# Patient Record
Sex: Male | Born: 1975 | Race: Black or African American | Hispanic: No | Marital: Single | Smoking: Never smoker
Health system: Southern US, Community
[De-identification: ages and names within clinical notes are randomized; demographics above are authoritative.]

## PROBLEM LIST (undated history)

## (undated) DIAGNOSIS — I10 Essential (primary) hypertension: Secondary | ICD-10-CM

## (undated) HISTORY — PX: CHOLECYSTECTOMY: SHX55

---

## 2001-03-22 ENCOUNTER — Emergency Department (HOSPITAL_COMMUNITY): Admission: EM | Admit: 2001-03-22 | Discharge: 2001-03-22 | Payer: Self-pay | Admitting: Emergency Medicine

## 2014-02-21 ENCOUNTER — Emergency Department (HOSPITAL_COMMUNITY)
Admission: EM | Admit: 2014-02-21 | Discharge: 2014-02-21 | Disposition: A | Discharge status: Home / Self Care | Payer: BC Managed Care – PPO | Attending: Emergency Medicine | Admitting: Emergency Medicine

## 2014-02-21 ENCOUNTER — Encounter (HOSPITAL_COMMUNITY): Payer: Self-pay | Admitting: Emergency Medicine

## 2014-02-21 DIAGNOSIS — F17200 Nicotine dependence, unspecified, uncomplicated: Secondary | ICD-10-CM | POA: Insufficient documentation

## 2014-02-21 DIAGNOSIS — R197 Diarrhea, unspecified: Secondary | ICD-10-CM | POA: Insufficient documentation

## 2014-02-21 DIAGNOSIS — R1115 Cyclical vomiting syndrome unrelated to migraine: Secondary | ICD-10-CM | POA: Insufficient documentation

## 2014-02-21 DIAGNOSIS — E860 Dehydration: Secondary | ICD-10-CM | POA: Insufficient documentation

## 2014-02-21 LAB — URINALYSIS, ROUTINE W REFLEX MICROSCOPIC
GLUCOSE, UA: NEGATIVE mg/dL
GLUCOSE, UA: NEGATIVE mg/dL
Hgb urine dipstick: NEGATIVE
Hgb urine dipstick: NEGATIVE
KETONES UR: 15 mg/dL — AB
Ketones, ur: 15 mg/dL — AB
Nitrite: POSITIVE — AB
Nitrite: NEGATIVE
PH: 6.5 (ref 5.0–8.0)
Protein, ur: 100 mg/dL — AB
Protein, ur: 30 mg/dL — AB
SPECIFIC GRAVITY, URINE: 1.038 — AB (ref 1.005–1.030)
SPECIFIC GRAVITY, URINE: 1.035 — AB (ref 1.005–1.030)
Urobilinogen, UA: 1 mg/dL (ref 0.0–1.0)
Urobilinogen, UA: 2 mg/dL — ABNORMAL HIGH (ref 0.0–1.0)
pH: 7 (ref 5.0–8.0)

## 2014-02-21 LAB — COMPREHENSIVE METABOLIC PANEL
ALT: 18 U/L (ref 0–53)
AST: 24 U/L (ref 0–37)
Albumin: 4.6 g/dL (ref 3.5–5.2)
Alkaline Phosphatase: 81 U/L (ref 39–117)
Anion gap: 13 (ref 5–15)
BILIRUBIN TOTAL: 1.2 mg/dL (ref 0.3–1.2)
BUN: 9 mg/dL (ref 6–23)
CHLORIDE: 104 meq/L (ref 96–112)
CO2: 26 mEq/L (ref 19–32)
CREATININE: 0.84 mg/dL (ref 0.50–1.35)
Calcium: 9.6 mg/dL (ref 8.4–10.5)
GFR calc Af Amer: >90 mL/min (ref >90)
GFR calc non Af Amer: >90 mL/min (ref >90)
Glucose, Bld: 112 mg/dL — ABNORMAL HIGH (ref 70–99)
Potassium: 3.5 mEq/L — ABNORMAL LOW (ref 3.7–5.3)
Sodium: 143 mEq/L (ref 137–147)
TOTAL PROTEIN: 7.5 g/dL (ref 6.0–8.3)

## 2014-02-21 LAB — CBC WITH DIFFERENTIAL/PLATELET
BASOS PCT: 0 % (ref 0–1)
Basophils Absolute: 0 10*3/uL (ref 0.0–0.1)
Eosinophils Absolute: 0.1 10*3/uL (ref 0.0–0.7)
Eosinophils Relative: 0 % (ref 0–5)
HCT: 48.1 % (ref 39.0–52.0)
Hemoglobin: 16.5 g/dL (ref 13.0–17.0)
Lymphocytes Relative: 17 % (ref 12–46)
Lymphs Abs: 2.2 10*3/uL (ref 0.7–4.0)
MCH: 29.4 pg (ref 26.0–34.0)
MCHC: 34.3 g/dL (ref 30.0–36.0)
MCV: 85.6 fL (ref 78.0–100.0)
MONO ABS: 1 10*3/uL (ref 0.1–1.0)
Monocytes Relative: 8 % (ref 3–12)
NEUTROS ABS: 9.7 10*3/uL — AB (ref 1.7–7.7)
NEUTROS PCT: 75 % (ref 43–77)
Platelets: 199 10*3/uL (ref 150–400)
RBC: 5.62 MIL/uL (ref 4.22–5.81)
RDW: 13.2 % (ref 11.5–15.5)
WBC: 13 10*3/uL — ABNORMAL HIGH (ref 4.0–10.5)

## 2014-02-21 LAB — URINE MICROSCOPIC-ADD ON

## 2014-02-21 MED ORDER — KETOROLAC TROMETHAMINE 30 MG/ML IJ SOLN
30.0000 mg | Freq: Once | INTRAMUSCULAR | Status: AC
  Administered 2014-02-21: 30 mg via INTRAVENOUS
  Filled 2014-02-21: qty 1

## 2014-02-21 MED ORDER — ONDANSETRON 4 MG PO TBDP: ORAL_TABLET | ORAL | Status: DC

## 2014-02-21 MED ORDER — ONDANSETRON HCL 4 MG/2ML IJ SOLN
4.0000 mg | Freq: Once | INTRAMUSCULAR | Status: AC
  Administered 2014-02-21: 4 mg via INTRAVENOUS
  Filled 2014-02-21: qty 2

## 2014-02-21 MED ORDER — METOPROLOL TARTRATE 25 MG PO TABS: 50.0000 mg | ORAL_TABLET | Freq: Once | ORAL | Status: DC

## 2014-02-21 MED ORDER — AMLODIPINE BESYLATE 5 MG PO TABS
10.0000 mg | ORAL_TABLET | Freq: Once | ORAL | Status: AC
  Administered 2014-02-21: 10 mg via ORAL
  Filled 2014-02-21: qty 2

## 2014-02-21 MED ORDER — SODIUM CHLORIDE 0.9 % IV BOLUS (SEPSIS)
2000.0000 mL | Freq: Once | INTRAVENOUS | Status: AC
  Administered 2014-02-21: 2000 mL via INTRAVENOUS

## 2014-02-21 MED ORDER — SODIUM CHLORIDE 0.9 % IV BOLUS (SEPSIS): 1000.0000 mL | Freq: Once | INTRAVENOUS | Status: DC

## 2014-02-21 NOTE — ED Notes (Signed)
MD at bedside. 

## 2014-02-21 NOTE — ED Notes (Signed)
He states "ive had sweats, chills, vomiting and diarrhea since yesterday."

## 2014-02-21 NOTE — ED Provider Notes (Signed)
CSN: [id number]     Arrival date & time 02/21/14  1221 History   First MD Initiated Contact with Patient 02/21/14 1639     Chief Complaint  Patient presents with  . GI Problem     (Consider location/radiation/quality/duration/timing/severity/associated sxs/prior Treatment) The history is provided by the patient.  Paxtyn Boyar is a 37 y.o. male here with chills, vomiting, diarrhea. Symptoms since yesterday. Had numerous episodes of vomiting and diarrhea. Denies abdominal pain. Had some subjective chills as well. No chest pain or cough. No urinary symptoms.    History reviewed. No pertinent past medical history. History reviewed. No pertinent past surgical history. History reviewed. No pertinent family history. History  Substance Use Topics  . Smoking status: Current Every Day Smoker    Types: Cigarettes  . Smokeless tobacco: Not on file  . Alcohol Use: Yes    Review of Systems  Gastrointestinal: Positive for vomiting and diarrhea.  All other systems reviewed and are negative.     Allergies  Review of patient's allergies indicates no known allergies.  Home Medications   Prior to Admission medications   Not on File   BP 143/86  Pulse 51  Temp(Src) 98.5 F (36.9 C) (Oral)  Resp 20  SpO2 98% Physical Exam  Nursing note and vitals reviewed. Constitutional: He is oriented to person, place, and time.  Dehydrated, slightly uncomfortable   HENT:  Head: Normocephalic.  MM dry   Eyes: Conjunctivae are normal. Pupils are equal, round, and reactive to light.  Neck: Normal range of motion. Neck supple.  Cardiovascular: Normal rate, regular rhythm and normal heart sounds.   Pulmonary/Chest: Effort normal and breath sounds normal. No respiratory distress. He has no wheezes. He has no rales.  Abdominal: Soft. Bowel sounds are normal. He exhibits no distension. There is no tenderness. There is no rebound and no guarding.  Musculoskeletal: Normal range of motion. He exhibits  no edema and no tenderness.  Neurological: He is alert and oriented to person, place, and time. No cranial nerve deficit. Coordination normal.  Skin: Skin is warm and dry.  Psychiatric: He has a normal mood and affect. His behavior is normal. Judgment and thought content normal.    ED Course  Procedures (including critical care time) Labs Review Labs Reviewed  CBC WITH DIFFERENTIAL - Abnormal; Notable for the following:    WBC 13.0 (*)    Neutro Abs 9.7 (*)    All other components within normal limits  COMPREHENSIVE METABOLIC PANEL - Abnormal; Notable for the following:    Potassium 3.5 (*)    Glucose, Bld 112 (*)    All other components within normal limits  URINALYSIS, ROUTINE W REFLEX MICROSCOPIC - Abnormal; Notable for the following:    Color, Urine AMBER (*)    Specific Gravity, Urine 1.038 (*)    Bilirubin Urine SMALL (*)    Ketones, ur 15 (*)    Protein, ur 100 (*)    Nitrite POSITIVE (*)    Leukocytes, UA TRACE (*)    All other components within normal limits  URINE MICROSCOPIC-ADD ON - Abnormal; Notable for the following:    Bacteria, UA MANY (*)    All other components within normal limits  URINALYSIS, ROUTINE W REFLEX MICROSCOPIC - Abnormal; Notable for the following:    Color, Urine AMBER (*)    Specific Gravity, Urine 1.035 (*)    Bilirubin Urine SMALL (*)    Ketones, ur 15 (*)    Protein, ur 30 (*)  Urobilinogen, UA 2.0 (*)    Leukocytes, UA TRACE (*)    All other components within normal limits  URINE MICROSCOPIC-ADD ON - Abnormal; Notable for the following:    Bacteria, UA FEW (*)    All other components within normal limits  URINE CULTURE    Imaging Review No results found.   EKG Interpretation None      MDM   Final diagnoses:  None   Valerie SaltsRobert Forti is a 38 y.o. male here with vomiting, diarrhea, likely gastro. Abdomen soft and nontender. I doubt appy. UA contaminated as he didn't give a clean catch. Will hydrate and repeat UA.   8:32  PM Repeat UA showed no obvious UTI. Felt better, tolerated PO fluids. Will d/c home on zofran prn.     Richardean Canalavid H Yao, MD 02/21/14 2032

## 2014-02-21 NOTE — ED Notes (Signed)
Waiting for fluid infusion to be completed

## 2014-02-21 NOTE — ED Notes (Signed)
Pt states he is unable to give urine at this time.

## 2014-02-21 NOTE — Discharge Instructions (Signed)
Rest for 3 days.   Stay hydrated.   Take zofran for nausea and tylenol and motrin for pain.   Follow up with your doctor.   Return to ER if you have vomiting, dehydration, severe abdominal pain, fevers.

## 2014-02-22 LAB — URINE CULTURE
Colony Count: NO GROWTH
Culture: NO GROWTH

## 2015-11-07 ENCOUNTER — Emergency Department (HOSPITAL_COMMUNITY)
Admission: EM | Admit: 2015-11-07 | Discharge: 2015-11-07 | Disposition: A | Discharge status: Home / Self Care | Payer: BLUE CROSS/BLUE SHIELD | Attending: Emergency Medicine | Admitting: Emergency Medicine

## 2015-11-07 ENCOUNTER — Encounter (HOSPITAL_COMMUNITY): Payer: Self-pay

## 2015-11-07 ENCOUNTER — Emergency Department (HOSPITAL_COMMUNITY): Payer: BLUE CROSS/BLUE SHIELD

## 2015-11-07 DIAGNOSIS — M25572 Pain in left ankle and joints of left foot: Secondary | ICD-10-CM

## 2015-11-07 DIAGNOSIS — Y9289 Other specified places as the place of occurrence of the external cause: Secondary | ICD-10-CM | POA: Insufficient documentation

## 2015-11-07 DIAGNOSIS — S93402A Sprain of unspecified ligament of left ankle, initial encounter: Secondary | ICD-10-CM | POA: Diagnosis not present

## 2015-11-07 DIAGNOSIS — F17210 Nicotine dependence, cigarettes, uncomplicated: Secondary | ICD-10-CM | POA: Insufficient documentation

## 2015-11-07 DIAGNOSIS — W228XXA Striking against or struck by other objects, initial encounter: Secondary | ICD-10-CM | POA: Insufficient documentation

## 2015-11-07 DIAGNOSIS — S99912A Unspecified injury of left ankle, initial encounter: Secondary | ICD-10-CM | POA: Diagnosis present

## 2015-11-07 DIAGNOSIS — Y998 Other external cause status: Secondary | ICD-10-CM | POA: Insufficient documentation

## 2015-11-07 DIAGNOSIS — Y9364 Activity, baseball: Secondary | ICD-10-CM | POA: Diagnosis not present

## 2015-11-07 DIAGNOSIS — Z79899 Other long term (current) drug therapy: Secondary | ICD-10-CM | POA: Diagnosis not present

## 2015-11-07 MED ORDER — ACETAMINOPHEN 325 MG PO TABS
650.0000 mg | ORAL_TABLET | Freq: Once | ORAL | Status: AC
  Administered 2015-11-07: 650 mg via ORAL
  Filled 2015-11-07: qty 2

## 2015-11-07 MED ORDER — NAPROXEN 500 MG PO TABS: 500.0000 mg | ORAL_TABLET | Freq: Two times a day (BID) | ORAL | Status: DC

## 2015-11-07 NOTE — ED Notes (Signed)
Pt c/o L ankle pain after sliding into a base last night.  Pain score 8/10.  Pt reports taking Aleve earlier w/ a little relief.  Swelling noted.

## 2015-11-07 NOTE — ED Notes (Signed)
Patient was alert, oriented and stable upon discharge. RN went over AVS and patient had no further questions.  

## 2015-11-07 NOTE — ED Provider Notes (Signed)
CSN: [id number]     Arrival date & time 11/07/15  1635 History  By signing my name below, I, Tanda Rockers, attest that this documentation has been prepared under the direction and in the presence of Cheri Fowler, PA-C. Electronically Signed: Tanda Rockers, ED Scribe. 11/07/2015. 4:59 PM.   Chief Complaint  Patient presents with  . Ankle Pain   The history is provided by the patient. No language interpreter was used.     HPI Comments: Emmerich Cryer is a 40 y.o. male who presents to the Emergency Department complaining of sudden onset, constant, worsening, left ankle pain that began yesterday. Pt reports that he was playing softball and slid to the base, hitting his foot onto the base and twisting the ankle. Pt played the rest of the game with some pain, but noticed increased swelling afterwards. When he woke up this morning he had worsening pain to the area and was unable to bear weight onto the left leg. His pain is exacerbated with bending of the ankle. Pt has been taking Aleve without relief. Denies weakness, numbness, tingling, knee pain, or any other associated symptoms.   History reviewed. No pertinent past medical history. Past Surgical History  Procedure Laterality Date  . Cholecystectomy     History reviewed. No pertinent family history. Social History  Substance Use Topics  . Smoking status: Current Every Day Smoker    Types: Cigars  . Smokeless tobacco: None  . Alcohol Use: Yes    Review of Systems  Constitutional: Negative for fever.  Musculoskeletal: Positive for joint swelling and arthralgias (left ankle).  Skin: Negative for color change and wound.  Neurological: Negative for weakness and numbness.  All other systems reviewed and are negative.  Allergies  Review of patient's allergies indicates no known allergies.  Home Medications   Prior to Admission medications   Medication Sig Start Date End Date Taking? Authorizing Provider  amLODipine (NORVASC) 10 MG  tablet Take 10 mg by mouth daily.   Yes Historical Provider, MD  naproxen sodium (ANAPROX) 220 MG tablet Take 440 mg by mouth 2 (two) times daily as needed (pain).   Yes Historical Provider, MD  naproxen (NAPROSYN) 500 MG tablet Take 1 tablet (500 mg total) by mouth 2 (two) times daily. 11/07/15   Cheri Fowler, PA-C  ondansetron (ZOFRAN ODT) 4 MG disintegrating tablet  ODT q4 hours prn nausea/vomit Patient not taking: Reported on 11/07/2015 02/21/14   Richardean Canal, MD   BP 153/90 mmHg  Pulse 83  Temp(Src) 98.3 F (36.8 C) (Oral)  Resp 16  SpO2 98%   Physical Exam  Constitutional: He is oriented to person, place, and time. He appears well-developed and well-nourished.  Non-toxic appearance. He does not have a sickly appearance. He does not appear ill.  HENT:  Head: Normocephalic and atraumatic.  Mouth/Throat: Oropharynx is clear and moist.  Eyes: Conjunctivae are normal. Pupils are equal, round, and reactive to light.  Neck: Normal range of motion. Neck supple.  Cardiovascular: Normal rate, regular rhythm and normal heart sounds.   No murmur heard. Pulses:      Dorsalis pedis pulses are 2+ on the left side.  Pulmonary/Chest: Effort normal and breath sounds normal. No accessory muscle usage or stridor. No respiratory distress. He has no wheezes. He has no rhonchi. He has no rales.  Abdominal: Soft. Bowel sounds are normal. He exhibits no distension. There is no tenderness.  Musculoskeletal: Normal range of motion.       Left ankle:  He exhibits swelling. He exhibits normal range of motion, no ecchymosis, no deformity and normal pulse. Tenderness. Lateral malleolus and CF ligament tenderness found. Achilles tendon normal.  Lymphadenopathy:    He has no cervical adenopathy.  Neurological: He is alert and oriented to person, place, and time.  Strength and sensation intact distal to injury.   Skin: Skin is warm and dry.  Psychiatric: He has a normal mood and affect. His behavior is normal.     ED Course  Procedures (including critical care time)  DIAGNOSTIC STUDIES: Oxygen Saturation is 98% on RA, normal by my interpretation.    COORDINATION OF CARE: 4:57 PM-Discussed treatment plan which includes DG L Ankle with pt at bedside and pt agreed to plan.   Labs Review Labs Reviewed - No data to display  Imaging Review Dg Ankle Complete Left  11/07/2015  CLINICAL DATA:  Left ankle pain with weight-bearing after sliding into base last night. EXAM: LEFT ANKLE COMPLETE - 3+ VIEW COMPARISON:  None. FINDINGS: Mild inferior spurring of both the medial and lateral malleoli. Spurring along the distal tibial rim noted. No definite tibiotalar joint effusion. No abnormal widening of the mortise. Mild spurring of the talar neck dorsally and also of the navicular, along with plantar and Achilles calcaneal spurs. Questionable mild soft tissue swelling below the level of the medial malleolus. No definite swelling overlying the lateral malleolus. I do not see a discrete fracture. IMPRESSION: 1. Considerable spurring in the hindfoot and dorsal midfoot for age. I do not see a discrete fracture. If pain persists despite conservative therapy, MRI may be warranted for further characterization. Electronically Signed   By: Gaylyn RongWalter  Liebkemann M.D.   On: 11/07/2015 17:35   I have personally reviewed and evaluated these images as part of my medical decision-making.   EKG Interpretation None      MDM   Final diagnoses:  Left ankle pain  Ankle sprain, left, initial encounter    Plain films negative for acute fracture.  Considerable spurring in hindfoot and lateral malleolus.  If pain persists despite conservative therapy, recommend MRI.  Neurovascularly intact.  Mild swelling and tenderness on exam.  Patient given Tylenol, brace, and crutches in ED.  Plan to discharge home with Naproxen.  Ortho follow up.  Discussed return precautions.  Patient agrees and acknowledges the above plan for discharge.   I  personally performed the services described in this documentation, which was scribed in my presence. The recorded information has been reviewed and is accurate.      Cheri FowlerKayla Adalid Beckmann, PA-C 11/07/15 1811  Marily MemosJason Mesner, MD 11/08/15 1113

## 2015-11-07 NOTE — Discharge Instructions (Signed)
There is no evidence of fracture or dislocation on your x-rays.  However, they did note considerable spurring in the hindfoot and dorsal midfoot for age.  If pain persists despite conservative therapy, MRI may be warranted for further Characterization.  Take Naproxen twice a day.  Rest, Ice, Compression, and Elevation for symptom control.   Ankle Sprain An ankle sprain is an injury to the strong, fibrous tissues (ligaments) that hold the bones of your ankle joint together.  CAUSES An ankle sprain is usually caused by a fall or by twisting your ankle. Ankle sprains most commonly occur when you step on the outer edge of your foot, and your ankle turns inward. People who participate in sports are more prone to these types of injuries.  SYMPTOMS   Pain in your ankle. The pain may be present at rest or only when you are trying to stand or walk.  Swelling.  Bruising. Bruising may develop immediately or within 1 to 2 days after your injury.  Difficulty standing or walking, particularly when turning corners or changing directions. DIAGNOSIS  Your caregiver will ask you details about your injury and perform a physical exam of your ankle to determine if you have an ankle sprain. During the physical exam, your caregiver will press on and apply pressure to specific areas of your foot and ankle. Your caregiver will try to move your ankle in certain ways. An X-ray exam may be done to be sure a bone was not broken or a ligament did not separate from one of the bones in your ankle (avulsion fracture).  TREATMENT  Certain types of braces can help stabilize your ankle. Your caregiver can make a recommendation for this. Your caregiver may recommend the use of medicine for pain. If your sprain is severe, your caregiver may refer you to a surgeon who helps to restore function to parts of your skeletal system (orthopedist) or a physical therapist. HOME CARE INSTRUCTIONS   Apply ice to your injury for 1-2 days or as  directed by your caregiver. Applying ice helps to reduce inflammation and pain.  Put ice in a plastic bag.  Place a towel between your skin and the bag.  Leave the ice on for 15-20 minutes at a time, every 2 hours while you are awake.  Only take over-the-counter or prescription medicines for pain, discomfort, or fever as directed by your caregiver.  Elevate your injured ankle above the level of your heart as much as possible for 2-3 days.  If your caregiver recommends crutches, use them as instructed. Gradually put weight on the affected ankle. Continue to use crutches or a cane until you can walk without feeling pain in your ankle.  If you have a plaster splint, wear the splint as directed by your caregiver. Do not rest it on anything harder than a pillow for the first 24 hours. Do not put weight on it. Do not get it wet. You may take it off to take a shower or bath.  You may have been given an elastic bandage to wear around your ankle to provide support. If the elastic bandage is too tight (you have numbness or tingling in your foot or your foot becomes cold and blue), adjust the bandage to make it comfortable.  If you have an air splint, you may blow more air into it or let air out to make it more comfortable. You may take your splint off at night and before taking a shower or bath. Wiggle your  toes in the splint several times per day to decrease swelling. SEEK MEDICAL CARE IF:   You have rapidly increasing bruising or swelling.  Your toes feel extremely cold or you lose feeling in your foot.  Your pain is not relieved with medicine. SEEK IMMEDIATE MEDICAL CARE IF:  Your toes are numb or blue.  You have severe pain that is increasing. MAKE SURE YOU:   Understand these instructions.  Will watch your condition.  Will get help right away if you are not doing well or get worse.   This information is not intended to replace advice given to you by your health care provider. Make  sure you discuss any questions you have with your health care provider.   Document Released: 07/21/2005 Document Revised: 08/11/2014 Document Reviewed: 08/02/2011 Elsevier Interactive Patient Education [employer]

## 2016-08-24 IMAGING — CR DG ANKLE COMPLETE 3+V*L*
3 series · 3 of 3 positions shown · non-contrast
Comparison: None.

CLINICAL DATA: Left ankle pain with weight-bearing after sliding
into base last night.

EXAM:
LEFT ANKLE COMPLETE - 3+ VIEW

[x ankle ap left]
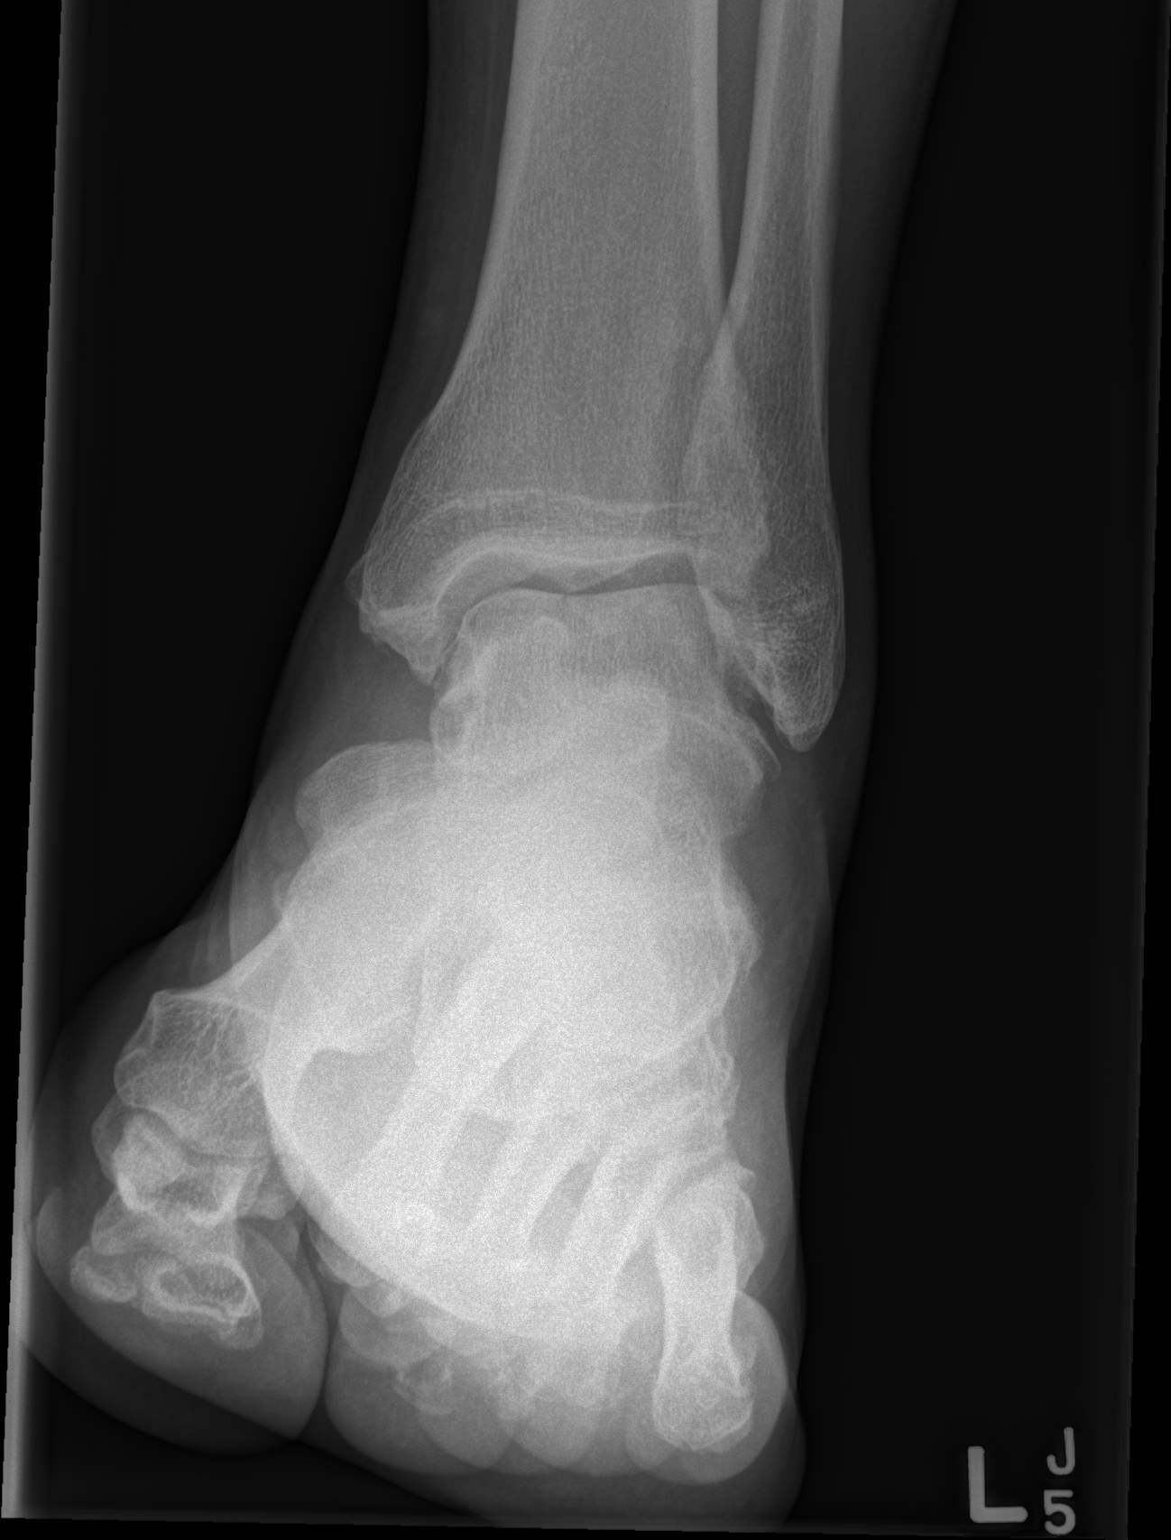

[x ankle lat left]
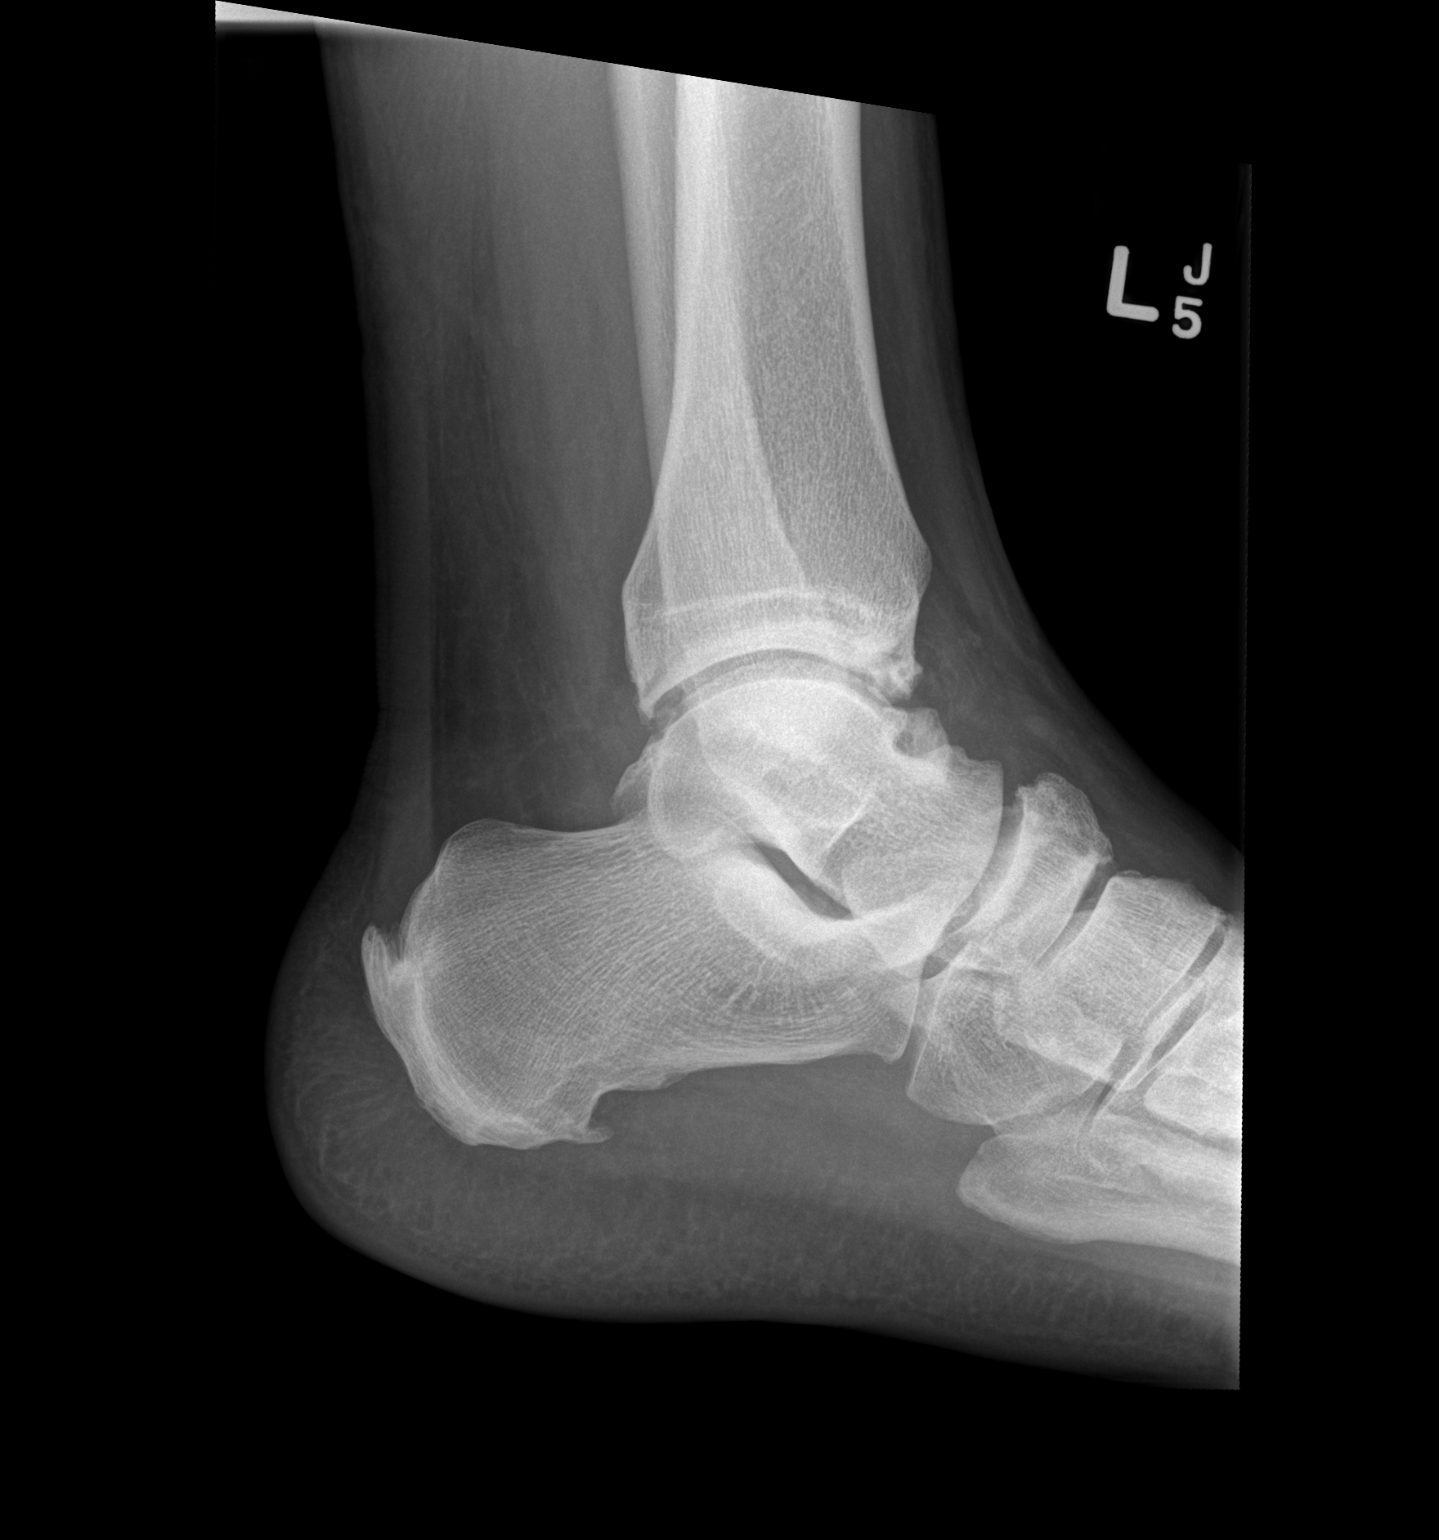

[x ankle obl left]
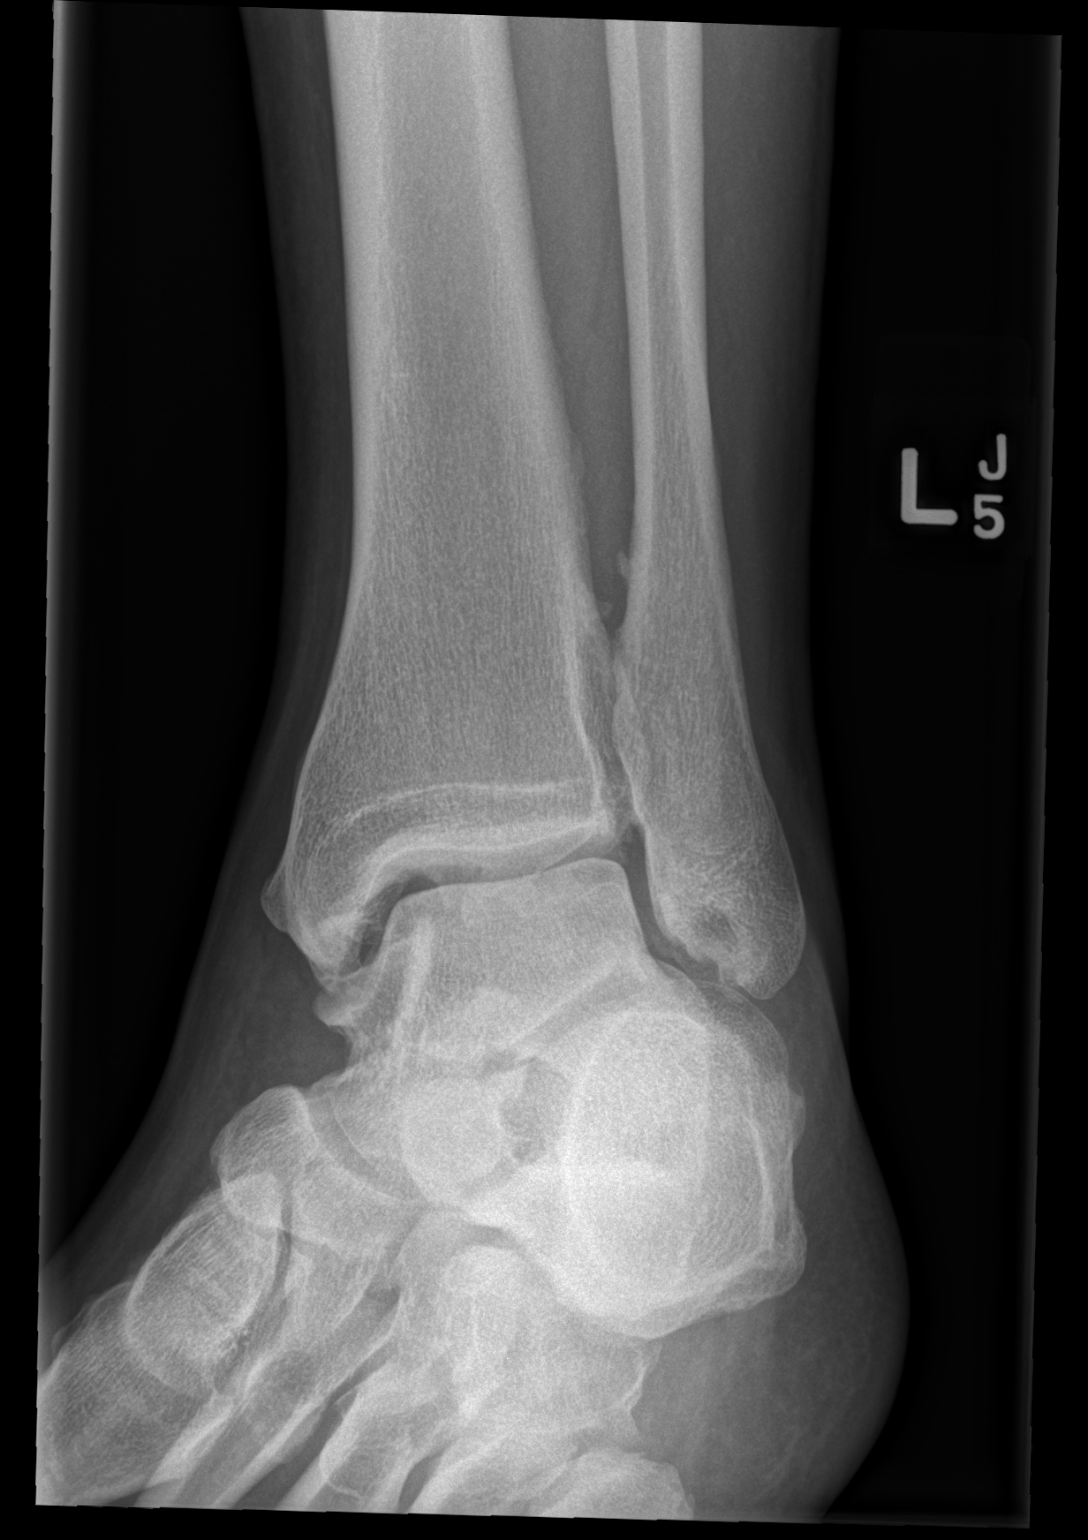

[3 of 3 positions shown; findings below may reference images not displayed]

FINDINGS: Mild inferior spurring of both the medial and lateral malleoli.
Spurring along the distal tibial rim noted. No definite tibiotalar
joint effusion. No abnormal widening of the mortise. Mild spurring
of the talar neck dorsally and also of the navicular, along with
plantar and Achilles calcaneal spurs.

Questionable mild soft tissue swelling below the level of the medial
malleolus. No definite swelling overlying the lateral malleolus. I
do not see a discrete fracture.
IMPRESSION: 1. Considerable spurring in the hindfoot and dorsal midfoot for age.
I do not see a discrete fracture. If pain persists despite
conservative therapy, MRI may be warranted for further
characterization.

## 2019-09-14 ENCOUNTER — Encounter: Payer: Self-pay | Admitting: Cardiovascular Disease

## 2019-09-14 ENCOUNTER — Ambulatory Visit (INDEPENDENT_AMBULATORY_CARE_PROVIDER_SITE_OTHER): Payer: Self-pay | Admitting: Cardiovascular Disease

## 2019-09-14 ENCOUNTER — Other Ambulatory Visit: Payer: Self-pay

## 2019-09-14 DIAGNOSIS — E669 Obesity, unspecified: Secondary | ICD-10-CM | POA: Insufficient documentation

## 2019-09-14 DIAGNOSIS — I10 Essential (primary) hypertension: Secondary | ICD-10-CM

## 2019-09-14 HISTORY — DX: Obesity, unspecified: E66.9

## 2019-09-14 HISTORY — DX: Morbid (severe) obesity due to excess calories: E66.01

## 2019-09-14 MED ORDER — AMLODIPINE BESYLATE 10 MG PO TABS: 10.0000 mg | ORAL_TABLET | Freq: Every day | ORAL | 5 refills | Status: DC

## 2019-09-14 NOTE — Patient Instructions (Addendum)
Medication Instructions:  START AMLODIPINE 10 MG DAILY    Labwork: CMET/A1C TODAY    Testing/Procedures: WILL DISCUSS SLEEP STUDY AT FOLLOW UP    Follow-Up: PHARM D IN OFFICE 09/29/2019 AT 8:00 AM    You will receive a phone call from the PREP exercise and nutrition program to schedule an initial assessment.  Special Instructions:   MONITOR YOUR BLOOD PRESSURE TWICE A DAY, LOG IN THE BOOK PROVIDED   If you have questions regarding your blood pressure or any medication changes from Monday through Friday, 8 AM through 5 PM please call Rose Fillers, RN [phone]) or Viann Fish, RN [phone]).  If you are in need of emergency care after hours, please call 911 or be seen in the emergency room.  DASH Eating Plan DASH stands for "Dietary Approaches to Stop Hypertension." The DASH eating plan is a healthy eating plan that has been shown to reduce high blood pressure (hypertension). It may also reduce your risk for type 2 diabetes, heart disease, and stroke. The DASH eating plan may also help with weight loss. What are tips for following this plan?  General guidelines  Avoid eating more than 2,300 mg (milligrams) of salt (sodium) a day. If you have hypertension, you may need to reduce your sodium intake to 1,500 mg a day.  Limit alcohol intake to no more than 1 drink a day for nonpregnant women and 2 drinks a day for men. One drink equals 12 oz of beer, 5 oz of wine, or 1 oz of hard liquor.  Work with your health care provider to maintain a healthy body weight or to lose weight. Ask what an ideal weight is for you.  Get at least 30 minutes of exercise that causes your heart to beat faster (aerobic exercise) most days of the week. Activities may include walking, swimming, or biking.  Work with your health care provider or diet and nutrition specialist (dietitian) to adjust your eating plan to your individual calorie needs. Reading food labels   Check food labels for the amount  of sodium per serving. Choose foods with less than 5 percent of the Daily Value of sodium. Generally, foods with less than 300 mg of sodium per serving fit into this eating plan.  To find whole grains, look for the word "whole" as the first word in the ingredient list. Shopping  Buy products labeled as "low-sodium" or "no salt added."  Buy fresh foods. Avoid canned foods and premade or frozen meals. Cooking  Avoid adding salt when cooking. Use salt-free seasonings or herbs instead of table salt or sea salt. Check with your health care provider or pharmacist before using salt substitutes.  Do not fry foods. Cook foods using healthy methods such as baking, boiling, grilling, and broiling instead.  Cook with heart-healthy oils, such as olive, canola, soybean, or sunflower oil. Meal planning  Eat a balanced diet that includes: ? 5 or more servings of fruits and vegetables each day. At each meal, try to fill half of your plate with fruits and vegetables. ? Up to 6-8 servings of whole grains each day. ? Less than 6 oz of lean meat, poultry, or fish each day. A 3-oz serving of meat is about the same size as a deck of cards. One egg equals 1 oz. ? 2 servings of low-fat dairy each day. ? A serving of nuts, seeds, or beans 5 times each week. ? Heart-healthy fats. Healthy fats called Omega-3 fatty acids are found in foods such  as flaxseeds and coldwater fish, like sardines, salmon, and mackerel.  Limit how much you eat of the following: ? Canned or prepackaged foods. ? Food that is high in trans fat, such as fried foods. ? Food that is high in saturated fat, such as fatty meat. ? Sweets, desserts, sugary drinks, and other foods with added sugar. ? Full-fat dairy products.  Do not salt foods before eating.  Try to eat at least 2 vegetarian meals each week.  Eat more home-cooked food and less restaurant, buffet, and fast food.  When eating at a restaurant, ask that your food be prepared with  less salt or no salt, if possible. What foods are recommended? The items listed may not be a complete list. Talk with your dietitian about what dietary choices are best for you. Grains Whole-grain or whole-wheat bread. Whole-grain or whole-wheat pasta. Brown rice. [place]. Bulgur. Whole-grain and low-sodium cereals. Pita bread. Low-fat, low-sodium crackers. Whole-wheat flour tortillas. Vegetables Fresh or frozen vegetables (raw, steamed, roasted, or grilled). Low-sodium or reduced-sodium tomato and vegetable juice. Low-sodium or reduced-sodium tomato sauce and tomato paste. Low-sodium or reduced-sodium canned vegetables. Fruits All fresh, dried, or frozen fruit. Canned fruit in natural juice (without added sugar). Meat and other protein foods Skinless chicken or [place]. Ground chicken or [place]. Pork with fat trimmed off. Fish and seafood. Egg whites. Dried beans, peas, or lentils. Unsalted nuts, nut butters, and seeds. Unsalted canned beans. Lean cuts of beef with fat trimmed off. Low-sodium, lean deli meat. Dairy Low-fat (1%) or fat-free (skim) milk. Fat-free, low-fat, or reduced-fat cheeses. Nonfat, low-sodium ricotta or cottage cheese. Low-fat or nonfat yogurt. Low-fat, low-sodium cheese. Fats and oils Soft margarine without trans fats. Vegetable oil. Low-fat, reduced-fat, or light mayonnaise and salad dressings (reduced-sodium). Canola, safflower, olive, soybean, and sunflower oils. Avocado. Seasoning and other foods Herbs. Spices. Seasoning mixes without salt. Unsalted popcorn and pretzels. Fat-free sweets. What foods are not recommended? The items listed may not be a complete list. Talk with your dietitian about what dietary choices are best for you. Grains Baked goods made with fat, such as croissants, muffins, or some breads. Dry pasta or rice meal packs. Vegetables Creamed or fried vegetables. Vegetables in a cheese sauce. Regular canned vegetables (not low-sodium or  reduced-sodium). Regular canned tomato sauce and paste (not low-sodium or reduced-sodium). Regular tomato and vegetable juice (not low-sodium or reduced-sodium). Angie Fava. Olives. Fruits Canned fruit in a light or heavy syrup. Fried fruit. Fruit in cream or butter sauce. Meat and other protein foods Fatty cuts of meat. Ribs. Fried meat. Berniece Salines. Sausage. Bologna and other processed lunch meats. Salami. Fatback. Hotdogs. Bratwurst. Salted nuts and seeds. Canned beans with added salt. Canned or smoked fish. Whole eggs or egg yolks. Chicken or [place] with skin. Dairy Whole or 2% milk, cream, and half-and-half. Whole or full-fat cream cheese. Whole-fat or sweetened yogurt. Full-fat cheese. Nondairy creamers. Whipped toppings. Processed cheese and cheese spreads. Fats and oils Butter. Stick margarine. Lard. Shortening. Ghee. Bacon fat. Tropical oils, such as coconut, palm kernel, or palm oil. Seasoning and other foods Salted popcorn and pretzels. Onion salt, garlic salt, seasoned salt, table salt, and sea salt. Worcestershire sauce. Tartar sauce. Barbecue sauce. Teriyaki sauce. Soy sauce, including reduced-sodium. Steak sauce. Canned and packaged gravies. Fish sauce. Oyster sauce. Cocktail sauce. Horseradish that you find on the shelf. Ketchup. Mustard. Meat flavorings and tenderizers. Bouillon cubes. Hot sauce and Tabasco sauce. Premade or packaged marinades. Premade or packaged taco seasonings. Relishes. Regular salad dressings.  Where to find more information:  National Heart, Lung, and [place]: https://wilson-eaton.com/  American Heart Association: www.heart.org Summary  The DASH eating plan is a healthy eating plan that has been shown to reduce high blood pressure (hypertension). It may also reduce your risk for type 2 diabetes, heart disease, and stroke.  With the DASH eating plan, you should limit salt (sodium) intake to 2,300 mg a day. If you have hypertension, you may need to reduce your sodium  intake to 1,500 mg a day.  When on the DASH eating plan, aim to eat more fresh fruits and vegetables, whole grains, lean proteins, low-fat dairy, and heart-healthy fats.  Work with your health care provider or diet and nutrition specialist (dietitian) to adjust your eating plan to your individual calorie needs. This information is not intended to replace advice given to you by your health care provider. Make sure you discuss any questions you have with your health care provider. Document Released: 07/10/2011 Document Revised: 07/03/2017 Document Reviewed: 07/14/2016 Elsevier Patient Education  2020 [employer].

## 2019-09-14 NOTE — Progress Notes (Signed)
Hypertension Clinic Initial Assessment:    Date:  09/14/2019   ID:  Christopher Bruce, DOB 12-08-1975, MRN [id number]  PCP:  Patient, No Pcp Per  Cardiologist:  No primary care provider on file.  Nephrologist:  Referring MD: No ref. provider found   CC: Hypertension  History of Present Illness:    Christopher Bruce is a 44 y.o. male with a hx of hypertension and obestiy here to establish care in the hypertension clinic.  He reports that he was first diagnosed with hypertension at around 36.  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  He followed up with the doctor regularly and his BP was well-controlled on amlodipine 10 mg.  Since moving to [place] several years ago, he hasn't had insurance and hasn't been on medicine.  He went to the dentist three weeks ago and his BP was elevated to over 200 mmHg systolic.  He was told that his BP had to be controlled to have his dental procedure.  He stopped drinking coffee 1 month ago.  He has been working to improve his diet.  He mostly eats at home and does not add salt to his food.  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  He has no exertional chest pain or shortness of breath.  He was hoping to join the [facility].  He denies lower extremity edema, orthopnea, or PND.  He does report snoring and his wife thinks he has apneic episodes.  Previous antihypertensives: Amlodipine   Past Medical History:  Diagnosis Date  . Morbid obesity (HCC) 09/14/2019    Past Surgical History:  Procedure Laterality Date  . CHOLECYSTECTOMY      Current Medications: Current Meds  Medication Sig  . [DISCONTINUED] naproxen (NAPROSYN) 500 MG tablet Take 1 tablet (500 mg total) by mouth 2 (two) times daily.  . [DISCONTINUED] naproxen sodium (ANAPROX) 220 MG tablet Take 440 mg by mouth 2 (two) times daily as needed (pain).  . [DISCONTINUED] ondansetron (ZOFRAN ODT) 4 MG disintegrating tablet 4mg  ODT q4 hours prn  nausea/vomit     Allergies:   Patient has no known allergies.   Social History   Socioeconomic History  . Marital status: Single    Spouse name: Not on file  . Number of children: Not on file  . Years of education: Not on file  . Highest education level: Not on file  Occupational History  . Not on file  Tobacco Use  . Smoking status: Never Smoker  . Smokeless tobacco: Never Used  Substance and Sexual Activity  . Alcohol use: Yes  . Drug use: No  . Sexual activity: Not on file  Other Topics Concern  . Not on file  Social History Narrative  . Not on file   Social Determinants of Health   Financial Resource Strain:   . Difficulty of Paying Living Expenses: Not on file  Food Insecurity: No Food Insecurity  . Worried About in the Last Year: Never true  . Ran Out of Food in the Last Year: Never true  Transportation Needs: No Transportation Needs  . Lack of Transportation (Medical): No  . Lack of Transportation (Non-Medical): No  Physical Activity: Insufficiently Active  . Days of Exercise per Week: 7 days  . Minutes of Exercise per Session: 20 min  Stress: No Stress Concern Present  . Feeling of Stress : Not at all  Social Connections:   .  Frequency of Communication with Friends and Family: Not on file  . Frequency of Social Gatherings with Friends and Family: Not on file  . Attends Religious Services: Not on file  . Active Member of Clubs or Organizations: Not on file  . Attends Banker Meetings: Not on file  . Marital Status: Not on file     Family History: The patient's family history includes Appendicitis in his mother; HIV in his father.  ROS:   Please see the history of present illness.     All other systems reviewed and are negative.  EKGs/Labs/Other Studies Reviewed:    EKG:  EKG is ordered today.  The ekg ordered today demonstrates sinus rhythm.  Rate 92 bpm.  LAD.  Incomplete LBBB.  LVH with repolarization abnormalities.    Recent Labs: No results found for requested labs within last 8760 hours.   Recent Lipid Panel No results found for: CHOL, TRIG, HDL, CHOLHDL, VLDL, LDLCALC, LDLDIRECT  Physical Exam:     Wt Readings from Last 3 Encounters:  09/14/19 (!) 374 lb (169.6 kg)  VS:  BP (!) 188/100 (BP Location: Left Arm, Patient Position: Sitting, Cuff Size: Large)   Pulse 92   Temp (!) 95.4 F (35.2 C)   Ht 6\' 9"  (2.057 m)   Wt (!) 374 lb (169.6 kg)   BMI 40.08 kg/m  , BMI Body mass index is 40.08 kg/m. GENERAL:  Well appearing HEENT: Pupils equal round and reactive, fundi not visualized, oral mucosa unremarkable NECK:  No jugular venous distention, waveform within normal limits, carotid upstroke brisk and symmetric, no bruits LUNGS:  Clear to auscultation bilaterally HEART:  RRR.  PMI not displaced or sustained,S1 and S2 within normal limits, no S3, no S4, no clicks, no rubs, no murmurs ABD:  Flat, positive bowel sounds normal in frequency in pitch, no bruits, no rebound, no guarding, no midline pulsatile mass, no hepatomegaly, no splenomegaly EXT:  2 plus pulses throughout, no edema, no cyanosis no clubbing SKIN:  No rashes no nodules NEURO:  Cranial nerves II through XII grossly intact, motor grossly intact throughout PSYCH:  Cognitively intact, oriented to person place and time   ASSESSMENT:    1. Morbid obesity (HCC)   2. Hypertension, unspecified type   3. Essential hypertension     PLAN:    # Hypertension:  BP poorly controlled.  He thinks it is higher than usual because of white coat hypertension.  We will resume amlodipine 10mg .  We have provided a BP cuff and asked him to track his BP twice daily.  Work on limiting salt and increasing exercise to 150 minutes weekly once BP is better controlled.  He is interested in participating in the PREP program.  Check CMP and hemoglobin A1c.   # Possible OSA: He has snoring and apneic episodes.  Will need a sleep study.  However will discuss  later after he meets with social work as he is currently uninsured.   # Morbid Obesity:  Exercise as above.  Check hemoglobin A1c.   Disposition:    FU with MD in 4 months.  PharmD in 2 weeks.   Medication Adjustments/Labs and Tests Ordered: Current medicines are reviewed at length with the patient today.  Concerns regarding medicines are outlined above.  Orders Placed This Encounter  Procedures  . Comprehensive metabolic panel  . HgB A1c  . EKG 12-Lead   Meds ordered this encounter  Medications  . amLODipine (NORVASC) 10 MG tablet  Sig: Take 1 tablet (10 mg total) by mouth daily.    Dispense:  30 tablet    Refill:  5     Signed, Skeet Latch, MD  09/14/2019 4:53 PM    [place]

## 2019-09-14 NOTE — Addendum Note (Signed)
Addended by: Regis Bill B on: 09/14/2019 06:03 PM   Modules accepted: Orders

## 2019-09-15 ENCOUNTER — Telehealth: Payer: Self-pay | Admitting: Licensed Clinical Social Worker

## 2019-09-15 LAB — COMPREHENSIVE METABOLIC PANEL
ALT: 16 IU/L (ref 0–44)
AST: 17 IU/L (ref 0–40)
Albumin/Globulin Ratio: 2.1 (ref 1.2–2.2)
Albumin: 4.4 g/dL (ref 4.0–5.0)
Alkaline Phosphatase: 88 IU/L (ref 39–117)
BUN/Creatinine Ratio: 8 — ABNORMAL LOW (ref 9–20)
BUN: 9 mg/dL (ref 6–24)
Bilirubin Total: 0.4 mg/dL (ref 0.0–1.2)
CO2: 25 mmol/L (ref 20–29)
Calcium: 9.3 mg/dL (ref 8.7–10.2)
Chloride: 104 mmol/L (ref 96–106)
Creatinine, Ser: 1.1 mg/dL (ref 0.76–1.27)
GFR calc Af Amer: 95 mL/min/{1.73_m2} (ref >59)
GFR calc non Af Amer: 82 mL/min/{1.73_m2} (ref >59)
Globulin, Total: 2.1 g/dL (ref 1.5–4.5)
Glucose: 84 mg/dL (ref 65–99)
Potassium: 4 mmol/L (ref 3.5–5.2)
Sodium: 142 mmol/L (ref 134–144)
Total Protein: 6.5 g/dL (ref 6.0–8.5)

## 2019-09-15 LAB — HEMOGLOBIN A1C
Est. average glucose Bld gHb Est-mCnc: 97 mg/dL
Hgb A1c MFr Bld: 5 % (ref 4.8–5.6)

## 2019-09-15 NOTE — Telephone Encounter (Signed)
CSW received referral to assist patient with resources and insurance options. Patient has HTN and has no insurance. 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 Patient also identified that he does not have a PCP. CSW discussed possibility of [facility] where he could have a PCP appointment and also see a Artist. Patient agreeable and CSW will attempt to get an appointment and return call to patient. CSW continues to follow. Lasandra Beech, LCSW, CCSW-MCS [phone]

## 2019-09-15 NOTE — Telephone Encounter (Signed)
CSW contacted patient to follow up with appointment for PCP visit on Wednesday March 10 at 3:30pm at [facility]. Patient will have PCP visit and be referred to financial counseling for assistance with medical bills and pharmacy needs. Patient grateful for the assistance and will call CSW if needed. Lasandra Beech, LCSW, CCSW-MCS [phone]

## 2019-09-16 ENCOUNTER — Telehealth: Payer: Self-pay

## 2019-09-16 NOTE — Telephone Encounter (Signed)
Called patient reference PREP program referral from HTN Clinic.  Unable to do the 3/9 class due to time of offering. Interested in program but will need an evening class.  Will call patient back when date/times/locations identified currently limited by COVID restrictions.

## 2019-09-22 ENCOUNTER — Telehealth: Payer: Self-pay

## 2019-09-22 NOTE — Telephone Encounter (Signed)
UNABLE TO LMOM TO MOVE APPT VOICEMAIL WAS FULL

## 2019-09-28 NOTE — Progress Notes (Deleted)
     09/28/2019 Christopher Bruce September 16, 1975 [id number]   HPI:  Christopher Bruce is a 44 y.o. male patient of Dr Duke Salvia, with a PMH below who presents today for hypertension clinic evaluation.  He was last seen by Dr Duke Salvia just 2 weeks ago, but found to have an in-office blood pressure of 188/100.  He notes that he first was diagnosed about 8 years ago.  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  Since moving to [place], he no longer has health insurance and had stopped the medication.  In January he went to a dental appointment - there his pressure was recorded at over 200 mmHg systolic.  Dr. Duke Salvia re-started the amlodipine and he was given a home BP cuff for tracking purposes.  She would also like to do a sleep study, however, is waiting until after he is able to meet with social workers, as he is uninsured.   Today he returns for a follow up visit.        Blood Pressure Goal:  130/80  Current Medications: amlodipine 10 mg   Family Hx:  Social Hx:  Diet: mostly home cooked foods, does not add salt  Exercise:  Home BP readings:  Intolerances: nkda  Labs: 2/21:  Na 142, K 4, Glu 84, BUN 9, SCr 1.1, A1c 5.0  Wt Readings from Last 3 Encounters:  09/14/19 (!) 374 lb (169.6 kg)   BP Readings from Last 3 Encounters:  09/14/19 (!) 188/100  11/07/15 153/90  02/21/14 161/76   Pulse Readings from Last 3 Encounters:  09/14/19 92  11/07/15 83  02/21/14 64    Current Outpatient Medications  Medication Sig Dispense Refill  . amLODipine (NORVASC) 10 MG tablet Take 1 tablet (10 mg total) by mouth daily. 30 tablet 5   No current facility-administered medications for this visit.    No Known Allergies  Past Medical History:  Diagnosis Date  . Morbid obesity (HCC) 09/14/2019    There were no vitals taken for this visit.  No problem-specific Assessment & Plan notes found for this encounter.   Phillips Hay PharmD CPP  [facility] [address] [place], [place] [address] [phone]

## 2019-09-29 ENCOUNTER — Ambulatory Visit: Payer: Self-pay

## 2019-09-29 ENCOUNTER — Ambulatory Visit: Payer: BLUE CROSS/BLUE SHIELD | Admitting: Cardiovascular Disease

## 2019-10-11 ENCOUNTER — Other Ambulatory Visit: Payer: Self-pay

## 2019-10-11 ENCOUNTER — Ambulatory Visit: Payer: Self-pay

## 2019-10-11 ENCOUNTER — Ambulatory Visit (INDEPENDENT_AMBULATORY_CARE_PROVIDER_SITE_OTHER): Payer: Self-pay | Admitting: Pharmacist

## 2019-10-11 VITALS — BP 168/96 | HR 70 | Ht 77.0 in

## 2019-10-11 DIAGNOSIS — I10 Essential (primary) hypertension: Secondary | ICD-10-CM

## 2019-10-11 MED ORDER — CHLORTHALIDONE 25 MG PO TABS: 25.0000 mg | ORAL_TABLET | Freq: Every day | ORAL | 1 refills | Status: DC

## 2019-10-11 NOTE — Progress Notes (Signed)
Patient ID: Christopher Bruce                 DOB: Dec 10, 1975                      MRN: [id number]    HPI: Christopher Bruce is a 44 y.o. male referred by Dr. Duke Salvia to HTN clinic. 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 Denies dizziness, swelling, shortness of breath or any problems with current therapy.   Current HTN meds:  Amlodipine 10mg  daily   Intolerance: none  BP goal: 130/80  Family History: The patient's family history includes appendicitis in his mother; HIV in his father.  Social History: denies tobacco products, occasional alcohol intake.  Diet: increased water and gave up sodas and coffee, more home cooked meals as well.  Exercise: activities of daily living  Home BP readings:  24 readings; range 137/107 to 174/99; HR 68-91bpm  Wt Readings from Last 3 Encounters:  10/12/19 (!) 370 lb (167.8 kg)  09/14/19 (!) 374 lb (169.6 kg)   BP Readings from Last 3 Encounters:  10/11/19 (!) 168/96  09/14/19 (!) 188/100  11/07/15 153/90   Pulse Readings from Last 3 Encounters:  10/11/19 70  09/14/19 92  11/07/15 83    Past Medical History:  Diagnosis Date  . Morbid obesity (HCC) 09/14/2019    Current Outpatient Medications on File Prior to Visit  Medication Sig Dispense Refill  . amLODipine (NORVASC) 10 MG tablet Take 1 tablet (10 mg total) by mouth daily. 30 tablet 5   No current facility-administered medications on file prior to visit.    No Known Allergies  Blood pressure (!) 168/96, pulse 70, height 6\' 5"  (1.956 m), SpO2 94 %.  Essential hypertension Blood pressure slightly improved but remains above goal of <130/80. Patient reports compliance with amlodipine 10mg  daily and denies problems with therapy. He is currently working on decreasing sodium in diet and weight loss. Will add chlorthalidone 25mg  daily to current therapy and repeat BMET in 2 weeks. Plan to  follow up in 4 weeks at HTN clinic.    Fredia Chittenden Rodriguez-Guzman PharmD, BCPS, CPP [facility] [address] [place] 10/17/2019 12:12 PM

## 2019-10-11 NOTE — Progress Notes (Deleted)
Patient ID: Christopher Bruce                 DOB: 1975-12-30                      MRN: [id number]     HPI: Jaquarious Grey is a 44 y.o. male referred by Dr. Duke Salvia to HTN clinic. PMH includes hypertension, possible OSA and obesity. Patient was diagnosed wth HTN at age 63 and was well controlled on amlodipine 10mg  daily. Patient stopped taking amlodipine after moving to  d/t lack of medical insurance. Dr resume amlodipine 10mg  daily on Fed/2021 and patient presents today for HTN follow up.  Current HTN meds:  Amlodipine 10mg  daily  Intolerance: none  BP goal: <130/80  Family History: The patient's family history includes Appendicitis in his mother; HIV in his father  Social History:   Diet:   Exercise:   Home BP readings:   Wt Readings from Last 3 Encounters:  09/14/19 (!) 374 lb (169.6 kg)   BP Readings from Last 3 Encounters:  09/14/19 (!) 188/100  11/07/15 153/90  02/21/14 161/76   Pulse Readings from Last 3 Encounters:  09/14/19 92  11/07/15 83  02/21/14 64    Renal function: CrCl cannot be calculated (Patient's most recent lab result is older than the maximum 21 days allowed.).  Past Medical History:  Diagnosis Date  . Morbid obesity (HCC) 09/14/2019    Current Outpatient Medications on File Prior to Visit  Medication Sig Dispense Refill  . amLODipine (NORVASC) 10 MG tablet Take 1 tablet (10 mg total) by mouth daily. 30 tablet 5   No current facility-administered medications on file prior to visit.    No Known Allergies  There were no vitals taken for this visit.  No problem-specific Assessment & Plan notes found for this encounter.    Nivaan Dicenzo Rodriguez-Guzman PharmD, BCPS, CPP [facility] [address] [place] [facility] 10/11/2019 8:54 AM

## 2019-10-11 NOTE — Patient Instructions (Addendum)
Return for a  follow up appointment in 4 weeks  Check your blood pressure at home daily (if able) and keep record of the readings.  Blood work in: 2 WEEKS  Take your BP meds as follows: *START taking chlorthalidone 25mg  daily*  Bring all of your meds, your BP cuff and your record of home blood pressures to your next appointment.  Exercise as you're able, try to walk approximately 30 minutes per day.  Keep salt intake to a minimum, especially watch canned and prepared boxed foods.  Eat more fresh fruits and vegetables and fewer canned items.  Avoid eating in fast food restaurants.    HOW TO TAKE YOUR BLOOD PRESSURE: . Rest 5 minutes before taking your blood pressure. .  Don't smoke or drink caffeinated beverages for at least 30 minutes before. . Take your blood pressure before (not after) you eat. . Sit comfortably with your back supported and both feet on the floor (don't cross your legs). . Elevate your arm to heart level on a table or a desk. . Use the proper sized cuff. It should fit smoothly and snugly around your bare upper arm. There should be enough room to slip a fingertip under the cuff. The bottom edge of the cuff should be 1 inch above the crease of the elbow. . Ideally, take 3 measurements at one sitting and record the average.

## 2019-10-12 ENCOUNTER — Ambulatory Visit: Payer: BLUE CROSS/BLUE SHIELD | Attending: Family Medicine | Admitting: Family Medicine

## 2019-10-12 ENCOUNTER — Encounter: Payer: Self-pay | Admitting: Family Medicine

## 2019-10-12 ENCOUNTER — Ambulatory Visit: Payer: BLUE CROSS/BLUE SHIELD | Admitting: Family Medicine

## 2019-10-12 VITALS — Ht 77.0 in | Wt 370.0 lb

## 2019-10-12 DIAGNOSIS — I10 Essential (primary) hypertension: Secondary | ICD-10-CM

## 2019-10-12 DIAGNOSIS — R0683 Snoring: Secondary | ICD-10-CM

## 2019-10-12 NOTE — Progress Notes (Signed)
Virtual Visit via Telephone Note  I connected with Christopher Bruce on 10/12/19 at  3:30 PM EST by telephone and verified that I am speaking with the correct person using two identifiers.   I discussed the limitations, risks, security and privacy concerns of performing an evaluation and management service by telephone and the availability of in person appointments. I also discussed with the patient that there may be a patient responsible charge related to this service. The patient expressed understanding and agreed to proceed.  Patient Location: Home Provider Location: [facility] Others participating in call: none   History of Present Illness:       44 yo male new to the practice, who reports that he went to the dentist earlier this year and his blood pressure was very elevated and his sister got him an appointment with cardiology.  He reports that when he first saw his cardiologist, Dr. Oval Linsey, his blood pressure was 200/114.  He reports that he is now on medication to help with his blood pressure and he is monitoring his blood pressure at home.  He did see the clinical pharmacist at his cardiologist office yesterday and a new medication was added.  He reports that his home blood pressures are currently in the 170s over 90s to 100s.  He denies headaches or dizziness related to his blood pressure, no chest pain or palpitations, no shortness of breath or cough.  No nausea/vomiting/diarrhea or constipation.  He does have some fatigue and upon questioning, he does snore as he states that his wife has told him that he snores and has awakened him at night due to snoring.  He is not sure if he has had any episodes of not breathing while asleep or waking up gasping for air.  He feels that he probably has sleep apnea but as he is currently uninsured, he cannot afford to be tested for this at this time.  He reports that he does have follow-up in 1 month with the clinical pharmacist at his cardiologist office  for recheck of his blood pressure.  Otherwise, he states that he feels fine.  Past Medical History:  Diagnosis Date  . Morbid obesity ([place]) 09/14/2019    Past Surgical History:  Procedure Laterality Date  . CHOLECYSTECTOMY      Family History  Problem Relation Age of Onset  . Appendicitis Mother   . HIV Father     Social History   Tobacco Use  . Smoking status: Never Smoker  . Smokeless tobacco: Never Used  Substance Use Topics  . Alcohol use: Yes  . Drug use: No     No Known Allergies     Observations/Objective: No vital signs or physical exam conducted as visit was done via telephone  Assessment and Plan: 1. Essential hypertension; 2.  Morbid obesity Patient's recent note from cardiology clinical pharmacist reviewed, patient is now on amlodipine and needs to pick up chlorthalidone from his pharmacy today.  He is encouraged to continue monitoring blood pressure and following a low-sodium/DASH diet along with fruits and weight loss.  Blood pressure at visit with clinical pharmacist was 168/96 yesterday.  2. Morbid obesity ([place]) 3. Snoring Discussed with patient that he may have obstructive sleep apnea based on his obesity and snoring.  Sleep apnea can also cause issues with elevated blood pressure/hypertension.  He is encouraged to apply for the [facility] financial program to see if he can get a discount on ongoing medical care with affiliated care providers as  he would likely benefit from sleep study and CPAP if needed.  He is encouraged to continue efforts at weight loss and to continue monitoring blood pressure.  He has been asked to follow-up here in this office in approximately 2 months, sooner if he has any issues or concerns.  Follow Up Instructions:Return in about 2 months (around 12/12/2019) for Hypertension/snoring.   I discussed the assessment and treatment plan with the patient. The patient was provided an opportunity to ask questions and all were answered. The patient  agreed with the plan and demonstrated an understanding of the instructions.   The patient was advised to call back or seek an in-person evaluation if the symptoms worsen or if the condition fails to improve as anticipated.  I provided 9 minutes of non-face-to-face time during this encounter.   Cain Saupe, MD

## 2019-10-17 ENCOUNTER — Encounter: Payer: Self-pay | Admitting: Pharmacist

## 2019-10-17 NOTE — Assessment & Plan Note (Signed)
Blood pressure slightly improved but remains above goal of <130/80. Patient reports compliance with amlodipine 10mg  daily and denies problems with therapy. He is currently working on decreasing sodium in diet and weight loss. Will add chlorthalidone 25mg  daily to current therapy and repeat BMET in 2 weeks. Plan to follow up in 4 weeks at HTN clinic.

## 2019-11-01 ENCOUNTER — Ambulatory Visit: Payer: Self-pay

## 2019-11-03 ENCOUNTER — Other Ambulatory Visit: Payer: Self-pay | Admitting: Cardiovascular Disease

## 2019-11-10 ENCOUNTER — Other Ambulatory Visit: Payer: Self-pay

## 2019-11-10 ENCOUNTER — Ambulatory Visit: Payer: BLUE CROSS/BLUE SHIELD | Attending: Internal Medicine

## 2019-11-10 ENCOUNTER — Ambulatory Visit (INDEPENDENT_AMBULATORY_CARE_PROVIDER_SITE_OTHER): Payer: BLUE CROSS/BLUE SHIELD | Admitting: Pharmacist

## 2019-11-10 VITALS — BP 158/94 | HR 79 | Resp 16 | Ht >= 80 in | Wt 369.0 lb

## 2019-11-10 DIAGNOSIS — I10 Essential (primary) hypertension: Secondary | ICD-10-CM

## 2019-11-10 DIAGNOSIS — Z23 Encounter for immunization: Secondary | ICD-10-CM

## 2019-11-10 LAB — BASIC METABOLIC PANEL
BUN/Creatinine Ratio: 11 (ref 9–20)
BUN: 12 mg/dL (ref 6–24)
CO2: 26 mmol/L (ref 20–29)
Calcium: 9.4 mg/dL (ref 8.7–10.2)
Chloride: 102 mmol/L (ref 96–106)
Creatinine, Ser: 1.09 mg/dL (ref 0.76–1.27)
GFR calc Af Amer: 96 mL/min/{1.73_m2} (ref >59)
GFR calc non Af Amer: 83 mL/min/{1.73_m2} (ref >59)
Glucose: 78 mg/dL (ref 65–99)
Potassium: 3.9 mmol/L (ref 3.5–5.2)
Sodium: 143 mmol/L (ref 134–144)

## 2019-11-10 MED ORDER — AMLODIPINE BESY-BENAZEPRIL HCL 10-40 MG PO CAPS: 1.0000 | ORAL_CAPSULE | Freq: Every day | ORAL | 1 refills | Status: DC

## 2019-11-10 NOTE — Progress Notes (Signed)
Patient ID: Christopher Bruce                 DOB: Jul 13, 1976                      MRN: [id number]    HPI: Christopher Bruce is a 44 y.o. male referred by Dr. Duke Salvia to HTN clinic. 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 Denies dizziness, swelling, shortness of breath or any problems with current therapy. Noted nor significant drop in BP after initiating chlorthalidone and no BMET completed either.   Current HTN meds:  Amlodipine 10mg  daily  - morning Chlorthalidone 25mg  daily - morning  Intolerance: none  BP goal: 130/80  Family History: The patient's family history includes appendicitis in his mother; HIV in his father.  Social History: denies tobacco products, occasional alcohol intake.  Diet: increased water and gave up sodas and coffee, more home cooked meals and smoothies in the mornings.   Exercise: activities of daily living  Home BP readings: no records available  Wt Readings from Last 3 Encounters:  11/10/19 (!) 369 lb (167.4 kg)  10/12/19 (!) 370 lb (167.8 kg)  09/14/19 (!) 374 lb (169.6 kg)   BP Readings from Last 3 Encounters:  11/10/19 (!) 158/94  10/11/19 (!) 168/96  09/14/19 (!) 188/100   Pulse Readings from Last 3 Encounters:  11/10/19 79  10/11/19 70  09/14/19 92    Past Medical History:  Diagnosis Date  . Morbid obesity ([facility]) 09/14/2019    Current Outpatient Medications on File Prior to Visit  Medication Sig Dispense Refill  . chlorthalidone (HYGROTON) 25 MG tablet TAKE 1 TABLET BY MOUTH EVERY DAY 30 tablet 3  . Multiple Vitamins-Minerals (MULTIVITAMIN WITH MINERALS) tablet Take 1 tablet by mouth daily.     No current facility-administered medications on file prior to visit.    No Known Allergies  Blood pressure (!) 158/94, pulse 79, resp. rate 16, height 6\' 9"  (2.057 m), weight (!) 369 lb (167.4 kg), SpO2 94 %.  Essential hypertension Blood pressure remains  above goal and not significantly changed since adding clorthalidone to therapy.  Will repeat BMET today and initiate lotrel 20/40 daily if renal function remains stable. Follow up scheduled in 4 weeks.   Christopher Bruce PharmD, BCPS, CPP [facility] [address] [place] [facility] 11/10/2019 10:38 AM

## 2019-11-10 NOTE — Patient Instructions (Addendum)
Return for a follow up appointment in 4 weeks  Go to the lab TODAY  Check your blood pressure at home daily (if able) and keep record of the readings.  Take your BP meds as follows: *STOP taking amlodipine* *START taking Lotrel (AMLODIPINE/BENAZEPORIL) 10MG -40MG   (WAIT UNTIL I CALL YOU WITH BLOOD WORK RESULTS)  Bring all of your meds, your BP cuff and your record of home blood pressures to your next appointment.  Exercise as you're able, try to walk approximately 30 minutes per day.  Keep salt intake to a minimum, especially watch canned and prepared boxed foods.  Eat more fresh fruits and vegetables and fewer canned items.  Avoid eating in fast food restaurants.    HOW TO TAKE YOUR BLOOD PRESSURE: . Rest 5 minutes before taking your blood pressure. .  Don't smoke or drink caffeinated beverages for at least 30 minutes before. . Take your blood pressure before (not after) you eat. . Sit comfortably with your back supported and both feet on the floor (don't cross your legs). . Elevate your arm to heart level on a table or a desk. . Use the proper sized cuff. It should fit smoothly and snugly around your bare upper arm. There should be enough room to slip a fingertip under the cuff. The bottom edge of the cuff should be 1 inch above the crease of the elbow. . Ideally, take 3 measurements at one sitting and record the average.

## 2019-11-10 NOTE — Progress Notes (Signed)
   Covid-19 Vaccination Clinic  Name:  Blakely Gluth    MRN: [id number] DOB: [birth year]  11/10/2019  Mr. Ellithorpe was observed post Covid-19 immunization for 15 minutes without incident. He was provided with Vaccine Information Sheet and instruction to access the V-Safe system.   Mr. Pitter was instructed to call 911 with any severe reactions post vaccine: Marland Kitchen Difficulty breathing  . Swelling of face and throat  . A fast heartbeat  . A bad rash all over body  . Dizziness and weakness   Immunizations Administered    Name Date Dose VIS Date Route   Pfizer COVID-19 Vaccine 11/10/2019  1:11 PM 0.3 mL 07/15/2019 Intramuscular   Manufacturer: [employer], [employer]   Lot: [id number]   NDC: [address]-9

## 2019-11-10 NOTE — Assessment & Plan Note (Signed)
Blood pressure remains above goal and not significantly changed since adding clorthalidone to therapy.  Will repeat BMET today and initiate lotrel 20/40 daily if renal function remains stable. Follow up scheduled in 4 weeks.

## 2019-11-11 ENCOUNTER — Telehealth: Payer: Self-pay | Admitting: Pharmacist

## 2019-11-11 NOTE — Telephone Encounter (Signed)
Renal function and electrolytes WNL. Okay to start new prescription for Lotrel.

## 2019-11-11 NOTE — Telephone Encounter (Signed)
Talked to Christopher Bruce today 4/9 @ 10:37am and intrusted to proceed with plan to start lotrel as written on AVS.

## 2019-12-03 ENCOUNTER — Other Ambulatory Visit: Payer: Self-pay | Admitting: Cardiovascular Disease

## 2019-12-05 ENCOUNTER — Ambulatory Visit: Payer: Self-pay | Attending: Internal Medicine

## 2019-12-05 DIAGNOSIS — Z23 Encounter for immunization: Secondary | ICD-10-CM

## 2019-12-05 NOTE — Telephone Encounter (Signed)
Rx has been sent to the pharmacy electronically. ° °

## 2019-12-05 NOTE — Progress Notes (Signed)
   Covid-19 Vaccination Clinic  Name:  Christopher Bruce    MRN: [id number] DOB: 02/16/1976  12/05/2019  Christopher Bruce was observed post Covid-19 immunization for 15 minutes without incident. He was provided with Vaccine Information Sheet and instruction to access the V-Safe system.   Christopher Bruce was instructed to call 911 with any severe reactions post vaccine: Marland Kitchen Difficulty breathing  . Swelling of face and throat  . A fast heartbeat  . A bad rash all over body  . Dizziness and weakness   Immunizations Administered    Name Date Dose VIS Date Route   Pfizer COVID-19 Vaccine 12/05/2019  4:14 PM 0.3 mL 09/28/2018 Intramuscular   Manufacturer: [employer], [employer]   Lot: [id number]   NDC: [address]-2

## 2019-12-13 ENCOUNTER — Other Ambulatory Visit: Payer: Self-pay

## 2019-12-13 ENCOUNTER — Ambulatory Visit (INDEPENDENT_AMBULATORY_CARE_PROVIDER_SITE_OTHER): Payer: Self-pay | Admitting: Pharmacist Clinician (PhC)/ Clinical Pharmacy Specialist

## 2019-12-13 DIAGNOSIS — I10 Essential (primary) hypertension: Secondary | ICD-10-CM

## 2019-12-13 MED ORDER — CHLORTHALIDONE 25 MG PO TABS: 25.0000 mg | ORAL_TABLET | Freq: Every day | ORAL | 3 refills | Status: DC

## 2019-12-13 MED ORDER — AMLODIPINE BESY-BENAZEPRIL HCL 10-40 MG PO CAPS: 1.0000 | ORAL_CAPSULE | Freq: Every day | ORAL | 3 refills | Status: DC

## 2019-12-13 NOTE — Progress Notes (Signed)
Patient ID: Christopher Bruce                 DOB: 07-Nov-1975                      MRN: [id number]    HPI: Christopher Bruce is a 44 y.o. male referred by Dr. Duke Salvia to HTN clinic. 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   Other than hypertension, his medical history is only significant for obesity.  When he was first seen by Dr. Duke Salvia in the hypertension clinic, his pressure was 188/100.  Patient believed that some of this was due to white-coat hypertension.  He had previously been on amlodipine, so was restarted at 10 mg daily.  He has been seen by [facility] twice since then.  Chlorthalidone was added, and by his second visit, pressure was down to 158/94.  At that time benazepril was added, and to save the patient money, was prescribed as a combination Lotrel capsule, with the amlodipine.    Today he returns for follow up.  He denies dizziness, lower extremity edema, shortness of breath, chest pain or any problems with current therapy.  Unfortunately he has difficulty paying for his medications and ran out of chlorthalidone last week.  In addition, [place], so diet was probably not ideal, higher sodium intake.     Current HTN meds:  Amlodipine-Benazepril 10-40 mg daily  - morning Chlorthalidone 25mg  daily - morning  Intolerance: none  BP goal: 130/80  Family History: The patient's family history includes appendicitis in his mother; HIV in his father.  Social History: denies tobacco products, occasional alcohol intake.  Diet: increased water and gave up sodas and coffee, more home cooked meals and smoothies in the mornings (kale, flax, honey, berries and protein powder).  Trying to eat mostly grilled or air fried foods, eats salad with grilled chicken once or twice each week.    Exercise: activities of daily living  Home BP readings: no records available; 155-159/95-97 by recall; nothing as high as 170 systolic anymore  Wt  Readings from Last 3 Encounters:  12/13/19 (!) 370 lb (167.8 kg)  11/10/19 (!) 369 lb (167.4 kg)  10/12/19 (!) 370 lb (167.8 kg)   BP Readings from Last 3 Encounters:  12/13/19 (!) 170/96  11/10/19 (!) 158/94  10/11/19 (!) 168/96   Pulse Readings from Last 3 Encounters:  12/13/19 64  11/10/19 79  10/11/19 70    Past Medical History:  Diagnosis Date  . Morbid obesity ([facility]) 09/14/2019    Current Outpatient Medications on File Prior to Visit  Medication Sig Dispense Refill  . Multiple Vitamins-Minerals (MULTIVITAMIN WITH MINERALS) tablet Take 1 tablet by mouth daily.     No current facility-administered medications on file prior to visit.    No Known Allergies  Blood pressure (!) 170/96, pulse 64, temperature 98.2 F (36.8 C), height 6\' 9"  (2.057 m), weight (!) 370 lb (167.8 kg).  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  Today his pressure has gone up significantly, due to compliance issues.  He has no health insurance and buys his medications at 11/12/2019.  We added the GoodRx app to his smartphone and sent prescriptions for a 90 day supply of both, to .  This will save him quite a bit and hopefully increase compliance with the 90 day fills.  He is to  continue with both medications and home BP monitoring  He has now been seen three times in [facility] and is due to see Dr. Oval Linsey again in 4-6 weeks.     [name] PharmD CPP [facility] [address] [place] [address] 12/14/2019 7:49 AM

## 2019-12-13 NOTE — Patient Instructions (Signed)
Return for a a follow up appointment in 4-6 weeks - we will reach out to you about this  Check your blood pressure at home daily and keep record of the readings.  Take your BP meds as follows:  Continue with amlodipine/benazepril and chlorthlaidone  Bring all of your meds, your BP cuff and your record of home blood pressures to your next appointment.  Exercise as you're able, try to walk approximately 30 minutes per day.  Keep salt intake to a minimum, especially watch canned and prepared boxed foods.  Eat more fresh fruits and vegetables and fewer canned items.  Avoid eating in fast food restaurants.    HOW TO TAKE YOUR BLOOD PRESSURE: . Rest 5 minutes before taking your blood pressure. .  Don't smoke or drink caffeinated beverages for at least 30 minutes before. . Take your blood pressure before (not after) you eat. . Sit comfortably with your back supported and both feet on the floor (don't cross your legs). . Elevate your arm to heart level on a table or a desk. . Use the proper sized cuff. It should fit smoothly and snugly around your bare upper arm. There should be enough room to slip a fingertip under the cuff. The bottom edge of the cuff should be 1 inch above the crease of the elbow. . Ideally, take 3 measurements at one sitting and record the average.

## 2019-12-14 ENCOUNTER — Encounter: Payer: Self-pay | Admitting: Pharmacist Clinician (PhC)/ Clinical Pharmacy Specialist

## 2019-12-14 NOTE — Assessment & Plan Note (Addendum)
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  Today his pressure has gone up significantly, due to compliance issues.  He has no health insurance and buys his medications at [facility].  We added the GoodRx app to his smartphone and sent prescriptions for a 90 day supply of both, to [facility].  This will save him quite a bit and hopefully increase compliance with the 90 day fills.  He is to continue with both medications and home BP monitoring  He has now been seen three times in [facility] and is due to see Dr. Duke Salvia again in 4-6 weeks.

## 2019-12-22 ENCOUNTER — Telehealth: Payer: Self-pay | Admitting: *Deleted

## 2019-12-22 NOTE — Telephone Encounter (Signed)
Spoke with patient and scheduled follow for ADV HTN CLINIC

## 2019-12-22 NOTE — Telephone Encounter (Signed)
Tried to call patient to scheduled ADV HTN F/U  VM full will try again

## 2020-01-05 ENCOUNTER — Ambulatory Visit: Payer: Self-pay | Admitting: Cardiovascular Disease

## 2020-01-09 ENCOUNTER — Other Ambulatory Visit: Payer: Self-pay

## 2020-01-09 ENCOUNTER — Ambulatory Visit (INDEPENDENT_AMBULATORY_CARE_PROVIDER_SITE_OTHER): Payer: Self-pay | Admitting: Cardiovascular Disease

## 2020-01-09 ENCOUNTER — Encounter: Payer: Self-pay | Admitting: Cardiovascular Disease

## 2020-01-09 VITALS — BP 170/90 | HR 86 | Ht >= 80 in | Wt 374.0 lb

## 2020-01-09 DIAGNOSIS — Z5181 Encounter for therapeutic drug level monitoring: Secondary | ICD-10-CM

## 2020-01-09 DIAGNOSIS — I10 Essential (primary) hypertension: Secondary | ICD-10-CM

## 2020-01-09 DIAGNOSIS — R0683 Snoring: Secondary | ICD-10-CM

## 2020-01-09 HISTORY — DX: Snoring: R06.83

## 2020-01-09 MED ORDER — SPIRONOLACTONE 25 MG PO TABS
25.0000 mg | ORAL_TABLET | Freq: Every day | ORAL | 3 refills | Status: DC
Start: 2020-01-09 — End: 2020-11-23

## 2020-01-09 NOTE — Patient Instructions (Addendum)
Medication Instructions:  START SPIRONOLACTONE 25 MG DAILY    Labwork: BMET IN A 1 WEEK    Testing/Procedures: NONE    Follow-Up: Your physician recommends that you schedule a follow-up appointment in: 02/09/2020 AT 9:30 WITH PHARM D   Special Instructions:   MONITOR YOUR BLOOD PRESSURE TWICE A DAY, BRING READINGS AND MACHINE TO FOLLOW UP

## 2020-01-09 NOTE — Progress Notes (Signed)
Hypertension Clinic Initial Assessment:    Date:  01/09/2020   ID:  Christopher Bruce, DOB Nov 04, 1975, MRN [id number]  PCP:  Cain Saupe, MD  Cardiologist:  No primary care provider on file.  Nephrologist:  Referring MD: Cain Saupe, MD   CC: Hypertension  History of Present Illness:    Christopher Bruce is a 44 y.o. male with a hx of hypertension and obestiy here for follow up.  He established care in the advanced hypertension clinic 09/2019.  He reports that he was first diagnosed with hypertension at around 36.  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  He followed up with the doctor regularly and his BP was well-controlled on amlodipine 10 mg.  Since moving to [place] several years ago, he hasn't had insurance and hasn't been on medicine.  He went to the dentist three weeks ago and his BP was elevated to over 200 mmHg systolic.  He was told that his BP had to be controlled to have his dental procedure.  At his first appointment amlodipine was restarted.  He was referred for sleep study but has not been able to be patient due to lack of insurance.  He has been trying to eat healthier and plans to start the keto diet.  His wife recently completed treatment for ovarian cancer and is cancer free.  They are looking to start getting more active. Chlorthalidone was added 10/2019 and benazepril 12/2019.  He has not noted significant change in his blood pressure.  It is running mostly in the 160-170/90s.  He was given a blood pressure cuff but it is not measuring his upper arm accurately.  They mostly cook at home and he tries to limit his sodium intake.  He takes his medications daily.  Previous antihypertensives: Amlodipine   Past Medical History:  Diagnosis Date  . Morbid obesity ([facility]) 09/14/2019  . Snoring 01/09/2020    Past Surgical History:  Procedure Laterality Date  . CHOLECYSTECTOMY      Current Medications: No outpatient medications have been marked as taking for  the 01/09/20 encounter (Office Visit) with Chilton Si, MD.     Allergies:   Patient has no known allergies.   Social History   Socioeconomic History  . Marital status: Single    Spouse name: Not on file  . Number of children: Not on file  . Years of education: Not on file  . Highest education level: Not on file  Occupational History  . Not on file  Tobacco Use  . Smoking status: Never Smoker  . Smokeless tobacco: Never Used  Substance and Sexual Activity  . Alcohol use: Yes  . Drug use: No  . Sexual activity: Not on file  Other Topics Concern  . Not on file  Social History Narrative  . Not on file   Social Determinants of Health   Financial Resource Strain:   . Difficulty of Paying Living Expenses:   Food Insecurity: No Food Insecurity  . Worried About Programme researcher, broadcasting/film/video in the Last Year: Never true  . Ran Out of Food in the Last Year: Never true  Transportation Needs: No Transportation Needs  . Lack of Transportation (Medical): No  . Lack of Transportation (Non-Medical): No  Physical Activity: Insufficiently Active  . Days of Exercise per Week: 7 days  . Minutes of Exercise per Session: 20 min  Stress: No Stress Concern Present  . Feeling of Stress : Not at all  Social Connections:   . Frequency of Communication with Friends and Family:   . Frequency of Social Gatherings with Friends and Family:   . Attends Religious Services:   . Active Member of Clubs or Organizations:   . Attends Archivist Meetings:   Marland Kitchen Marital Status:      Family History: The patient's family history includes Appendicitis in his mother; HIV in his father.  ROS:   Please see the history of present illness.     All other systems reviewed and are negative.  EKGs/Labs/Other Studies Reviewed:    EKG:  EKG is ordered today.  The ekg ordered today demonstrates sinus rhythm.  Rate 92 bpm.  LAD.  Incomplete LBBB.  LVH with repolarization abnormalities.   Recent  Labs: 09/14/2019: ALT 16 11/10/2019: BUN 12; Creatinine, Ser 1.09; Potassium 3.9; Sodium 143   Recent Lipid Panel No results found for: CHOL, TRIG, HDL, CHOLHDL, VLDL, LDLCALC, LDLDIRECT  Physical Exam:     Wt Readings from Last 3 Encounters:  01/09/20 (!) 374 lb (169.6 kg)  12/13/19 (!) 370 lb (167.8 kg)  11/10/19 (!) 369 lb (167.4 kg)  VS:  BP (!) 170/90   Pulse 86   Ht 6\' 9"  (2.057 m)   Wt (!) 374 lb (169.6 kg)   SpO2 98%   BMI 40.08 kg/m  , BMI Body mass index is 40.08 kg/m. GENERAL:  Well appearing HEENT: Pupils equal round and reactive, fundi not visualized, oral mucosa unremarkable NECK:  No jugular venous distention, waveform within normal limits, carotid upstroke brisk and symmetric, no bruits LUNGS:  Clear to auscultation bilaterally HEART:  RRR.  PMI not displaced or sustained,S1 and S2 within normal limits, no S3, no S4, no clicks, no rubs, no murmurs ABD:  Flat, positive bowel sounds normal in frequency in pitch, no bruits, no rebound, no guarding, no midline pulsatile mass, no hepatomegaly, no splenomegaly EXT:  2 plus pulses throughout, no edema, no cyanosis no clubbing SKIN:  No rashes no nodules NEURO:  Cranial nerves II through XII grossly intact, motor grossly intact throughout PSYCH:  Cognitively intact, oriented to person place and time  ASSESSMENT:    1. Essential hypertension   2. Therapeutic drug monitoring   3. Morbid obesity ([place])   4. Snoring     PLAN:    # Hypertension:  BP remains poorly controlled on multiple agents.  He is going to bring his home blood pressure cuff to the office and check it on his forearm to see if that is accurate.  We discussed looking for secondary causes including checking serum renin and aldosterone.  We also discussed sleep study and renal Dopplers.  However given that he does not have insurance right now he is not interested in doing these things.  We will start spironolactone 25 mg daily and check a BMP in 1 week.  He  is very excited to start back exercising and this was encouraged.  He plans to start doing the keto diet with his wife.  # Possible OSA: He has snoring and apneic episodes.  Will need a sleep study when able.  # Morbid Obesity:  Exercise as above.    Disposition:    FU with MD in 4 months.  PharmD in 1 month.   Medication Adjustments/Labs and Tests Ordered: Current medicines are reviewed at length with the patient today.  Concerns regarding medicines are outlined above.  Orders Placed This Encounter  Procedures  . Basic metabolic panel  Meds ordered this encounter  Medications  . spironolactone (ALDACTONE) 25 MG tablet    Sig: Take 1 tablet (25 mg total) by mouth daily.    Dispense:  90 tablet    Refill:  3     Signed, Chilton Si, MD  01/09/2020 4:42 PM    [facility]

## 2020-01-31 ENCOUNTER — Telehealth: Payer: Self-pay

## 2020-01-31 NOTE — Telephone Encounter (Signed)
Call placed to pt reference PREP referral-Agreeable to evening class starting on 02/21/20 615pm to 730pm.  Will call back to conduct intake assessment.

## 2020-02-09 ENCOUNTER — Ambulatory Visit: Payer: Self-pay

## 2020-07-16 ENCOUNTER — Other Ambulatory Visit: Payer: Self-pay | Admitting: *Deleted

## 2020-07-16 MED ORDER — CHLORTHALIDONE 25 MG PO TABS: 25.0000 mg | ORAL_TABLET | Freq: Every day | ORAL | 3 refills | Status: DC

## 2020-07-16 NOTE — Telephone Encounter (Signed)
Rx has been sent to the pharmacy electronically. ° °

## 2020-08-28 ENCOUNTER — Other Ambulatory Visit: Payer: Self-pay | Admitting: *Deleted

## 2020-08-28 MED ORDER — CHLORTHALIDONE 25 MG PO TABS: 25.0000 mg | ORAL_TABLET | Freq: Every day | ORAL | 3 refills | Status: DC

## 2020-11-23 ENCOUNTER — Other Ambulatory Visit: Payer: Self-pay

## 2020-11-23 ENCOUNTER — Ambulatory Visit (INDEPENDENT_AMBULATORY_CARE_PROVIDER_SITE_OTHER): Payer: 59 | Admitting: Adult Health

## 2020-11-23 ENCOUNTER — Encounter: Payer: Self-pay | Admitting: Adult Health

## 2020-11-23 VITALS — BP 178/78 | HR 66 | Ht >= 80 in | Wt 388.0 lb

## 2020-11-23 DIAGNOSIS — I10 Essential (primary) hypertension: Secondary | ICD-10-CM | POA: Diagnosis not present

## 2020-11-23 DIAGNOSIS — R0683 Snoring: Secondary | ICD-10-CM

## 2020-11-23 DIAGNOSIS — Z13220 Encounter for screening for lipoid disorders: Secondary | ICD-10-CM | POA: Diagnosis not present

## 2020-11-23 DIAGNOSIS — Z79899 Other long term (current) drug therapy: Secondary | ICD-10-CM

## 2020-11-23 MED ORDER — CHLORTHALIDONE 25 MG PO TABS: 25.0000 mg | ORAL_TABLET | Freq: Every day | ORAL | 3 refills | Status: DC

## 2020-11-23 MED ORDER — SPIRONOLACTONE 25 MG PO TABS: 25.0000 mg | ORAL_TABLET | Freq: Every day | ORAL | 3 refills | Status: DC

## 2020-11-23 MED ORDER — AMLODIPINE BESY-BENAZEPRIL HCL 10-40 MG PO CAPS: 1.0000 | ORAL_CAPSULE | Freq: Every day | ORAL | 3 refills | Status: DC

## 2020-11-23 NOTE — Progress Notes (Signed)
Cardiology Office Note   Date:  11/23/2020   ID:  Christopher Bruce, DOB 1975/10/20, MRN [id number]  PCP:  Christopher Saupe, MD (Inactive)  Cardiologist: Dr.Penn Bruce  No chief complaint on file.    History of Present Illness: Christopher Bruce is a pleasant  45 y.o. male who presents for ongoing assessment and management of hypertension, with hx of morbid obesity. He was referred for sleep study but has not had this completed. . Chlorthalidone was added 10/2019 and benazepril 12/2019.  He has not noted significant change in his blood pressure. Spironolactone was added at 25 mg daily.   He returns today for follow-up.  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  He would like to rededicate follow-ups and medication compliance as he is now able to afford.  He has been out of his medications for several months.  He denies any chest pressure, he continues to have some snoring issues, fatigue, but denies blurred vision headaches.   Past Medical History:  Diagnosis Date  . Morbid obesity (HCC) 09/14/2019  . Snoring 01/09/2020    Past Surgical History:  Procedure Laterality Date  . CHOLECYSTECTOMY       Current Outpatient Medications  Medication Sig Dispense Refill  . Multiple Vitamins-Minerals (MULTIVITAMIN WITH MINERALS) tablet Take 1 tablet by mouth daily.    Marland Kitchen amLODipine-benazepril (LOTREL) 10-40 MG capsule Take 1 capsule by mouth daily. 90 capsule 3  . chlorthalidone (HYGROTON) 25 MG tablet Take 1 tablet (25 mg total) by mouth daily. 90 tablet 3  . spironolactone (ALDACTONE) 25 MG tablet Take 1 tablet (25 mg total) by mouth daily. 90 tablet 3   No current facility-administered medications for this visit.    Allergies:   Patient has no known allergies.    Social History:  The patient  reports that he has never smoked. He has never used smokeless tobacco. He reports current alcohol use. He reports that he does not use drugs.   Family History:  The  patient's family history includes Appendicitis in his mother; HIV in his father.    ROS: All other systems are reviewed and negative. Unless otherwise mentioned in H&P    PHYSICAL EXAM: VS:  BP (!) 178/78   Pulse 66   Ht 6\' 9"  (2.057 m)   Wt (!) 388 lb (176 kg)   SpO2 97%   BMI 41.58 kg/m  , BMI Body mass index is 41.58 kg/m. GEN: Well nourished, well developed, in no acute distress, morbidly obese HEENT: normal Neck: no JVD, carotid bruits, or masses Cardiac: RRR; 1/6 systolic murmur heard best at the right sternal border, diminished heart sounds on the left, rubs, or gallops,no edema  Respiratory:  Clear to auscultation bilaterally, normal work of breathing GI: soft, nontender, nondistended, + BS, no abdominal bruits MS: no deformity or atrophy Skin: warm and dry, no rash Neuro:  Strength and sensation are intact Psych: euthymic mood, full affect   EKG: Sinus rhythm with first-degree AV block PR interval 0.21 ms, left atrial enlargement with LVH, with repolarization abnormality noted.  Heart rate of 66 bpm.  Recent Labs: No results found for requested labs within last 8760 hours.    Lipid Panel No results found for: CHOL, TRIG, HDL, CHOLHDL, VLDL, LDLCALC, LDLDIRECT    Wt Readings from Last 3 Encounters:  11/23/20 (!) 388 lb (176 kg)  01/09/20 (!) 374 lb (169.6 kg)  12/13/19 (!) 370 lb (167.8 kg)  Other studies Reviewed: None   ASSESSMENT AND PLAN:  1.  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  He is rededicated himself to becoming healthy and controlling his blood pressure.  Blood pressure is elevated today and I will restart his medications with the exception of the spironolactone.  As he was not taking his medications consistently before I did not want to give him all of them back until we were able to assess his response to medications.   Therefore I will continue amlodipine/benazepril 10/40 mg daily, and chlorthalidone 25 mg daily.  I have given him refills on spironolactone 25 mg daily but asked him to hold this until we had a follow-up appointment to evaluate his response.  In the interim I will order an echocardiogram for RV/LV function, valvular abnormalities and likely cardiomyopathy.  I will also check some labs to evaluate kidney function as baseline prior to starting the ACE and the diuretic, which will be drawn today.,  He will start his medications as soon as possible.  He is advised to take his blood pressure daily.  Since he is working nights he will take his medications when he awakens around 830 to 9 PM as his blood pressure will need control during waking hours especially.  I have educated the patient on the changes in his EKG and likely result of his echocardiogram.  He states that Dr. Duke Bruce has already discussed this with him in the past.  2.  OSA: I have ordered a sleep study which will be done at home as he will have a night shift study completed.  This has been arranged by Christopher Bruce, CMA.  3.  Risk management: We will check fasting lipids and LFTs to evaluate his cholesterol status in the setting of obesity.  He is advised to increase his activity and to get up and walk during the times that he is driving to avoid prolonged sitting.  4.  Morbid obesity: He has been advised on the need for weight loss.  He is aware of this and it is hopeful that now that he is taking hold of his health this will become more of a priority for him.  We will take this and steps with ongoing encouragement.  Current medicines are reviewed at length with the patient today.  I have spent 25 minutes dedicated to the care of this patient on the date of this encounter to include pre-visit review of records, assessment, management and diagnostic testing,with shared decision making.  Labs/ tests ordered today include: Echocardiogram, Home  sleep study, BMET, Lipids and LFTs   Christopher Bruce. Christopher Bruce, ANP, AACC   11/23/2020 4:10 PM    [facility] [address] Office [phone] Fax [phone]  Notice: This dictation was prepared with Dragon dictation along with smaller phrase technology. Any transcriptional errors that result from this process are unintentional and may not be corrected upon review.

## 2020-11-23 NOTE — Patient Instructions (Signed)
Medication Instructions:  Continue current medications  *If you need a refill on your cardiac medications before your next appointment, please call your pharmacy*   Lab Work: Fasting Lipid and Liver, BMP  If you have labs (blood work) drawn today and your tests are completely normal, you will receive your results only by: Marland Kitchen MyChart Message (if you have MyChart) OR . A paper copy in the mail If you have any lab test that is abnormal or we need to change your treatment, we will call you to review the results.   Testing/Procedures: Your physician has requested that you have an echocardiogram. Echocardiography is a painless test that uses sound waves to create images of your heart. It provides your doctor with information about the size and shape of your heart and how well your heart's chambers and valves are working. This procedure takes approximately one hour. There are no restrictions for this procedure.  Your physician has recommended that you have a sleep study. This test records several body functions during sleep, including: brain activity, eye movement, oxygen and carbon dioxide blood levels, heart rate and rhythm, breathing rate and rhythm, the flow of air through your mouth and nose, snoring, body muscle movements, and chest and belly movement.  Follow-Up: At [facility], you and your health needs are our priority.  As part of our continuing mission to provide you with exceptional heart care, we have created designated Provider Care Teams.  These Care Teams include your primary Cardiologist (physician) and Advanced Practice Providers (APPs -  Physician Assistants and Nurse Practitioners) who all work together to provide you with the care you need, when you need it.  We recommend signing up for the patient portal called "MyChart".  Sign up information is provided on this After Visit Summary.  MyChart is used to connect with patients for Virtual Visits (Telemedicine).  Patients are able  to view lab/test results, encounter notes, upcoming appointments, etc.  Non-urgent messages can be sent to your provider as well.   To learn more about what you can do with MyChart, go to [email or web address].    Your next appointment:   Friday May 27th @ 2:15 PM  The format for your next appointment:   In Person  Provider:   Joni Reining, DNP, ANP

## 2020-11-24 LAB — HEPATIC FUNCTION PANEL
ALT: 18 IU/L (ref 0–44)
AST: 18 IU/L (ref 0–40)
Albumin: 4.2 g/dL (ref 4.0–5.0)
Alkaline Phosphatase: 89 IU/L (ref 44–121)
Bilirubin Total: 0.4 mg/dL (ref 0.0–1.2)
Bilirubin, Direct: 0.14 mg/dL (ref 0.00–0.40)
Total Protein: 6.8 g/dL (ref 6.0–8.5)

## 2020-11-24 LAB — BASIC METABOLIC PANEL
BUN/Creatinine Ratio: 10 (ref 9–20)
BUN: 12 mg/dL (ref 6–24)
CO2: 23 mmol/L (ref 20–29)
Calcium: 9.3 mg/dL (ref 8.7–10.2)
Chloride: 102 mmol/L (ref 96–106)
Creatinine, Ser: 1.2 mg/dL (ref 0.76–1.27)
Glucose: 88 mg/dL (ref 65–99)
Potassium: 4 mmol/L (ref 3.5–5.2)
Sodium: 141 mmol/L (ref 134–144)
eGFR: 76 mL/min/{1.73_m2} (ref >59)

## 2020-11-24 LAB — LIPID PANEL
Chol/HDL Ratio: 4.5 ratio (ref 0.0–5.0)
Cholesterol, Total: 192 mg/dL (ref 100–199)
HDL: 43 mg/dL (ref >39)
LDL Chol Calc (NIH): 126 mg/dL — ABNORMAL HIGH (ref 0–99)
Triglycerides: 128 mg/dL (ref 0–149)
VLDL Cholesterol Cal: 23 mg/dL (ref 5–40)

## 2020-12-07 ENCOUNTER — Ambulatory Visit: Payer: Self-pay | Admitting: Student

## 2020-12-17 ENCOUNTER — Ambulatory Visit (HOSPITAL_BASED_OUTPATIENT_CLINIC_OR_DEPARTMENT_OTHER): Payer: 59 | Admitting: Cardiovascular Disease

## 2020-12-17 ENCOUNTER — Other Ambulatory Visit: Payer: Self-pay

## 2020-12-17 DIAGNOSIS — R0683 Snoring: Secondary | ICD-10-CM

## 2020-12-20 ENCOUNTER — Ambulatory Visit (HOSPITAL_BASED_OUTPATIENT_CLINIC_OR_DEPARTMENT_OTHER): Payer: 59 | Attending: Adult Health | Admitting: Cardiovascular Disease

## 2020-12-20 ENCOUNTER — Other Ambulatory Visit: Payer: Self-pay

## 2020-12-20 DIAGNOSIS — R0902 Hypoxemia: Secondary | ICD-10-CM | POA: Diagnosis not present

## 2020-12-20 DIAGNOSIS — G4733 Obstructive sleep apnea (adult) (pediatric): Secondary | ICD-10-CM | POA: Diagnosis present

## 2020-12-20 DIAGNOSIS — R0683 Snoring: Secondary | ICD-10-CM

## 2020-12-24 ENCOUNTER — Ambulatory Visit (HOSPITAL_COMMUNITY): Payer: 59 | Attending: Cardiovascular Disease

## 2020-12-24 ENCOUNTER — Encounter (HOSPITAL_COMMUNITY): Payer: Self-pay

## 2020-12-26 ENCOUNTER — Ambulatory Visit (HOSPITAL_COMMUNITY)
Admission: RE | Admit: 2020-12-26 | Discharge: 2020-12-26 | Disposition: A | Discharge status: Home / Self Care | Payer: 59 | Source: Ambulatory Visit | Attending: Adult Health | Admitting: Adult Health

## 2020-12-26 ENCOUNTER — Other Ambulatory Visit: Payer: Self-pay

## 2020-12-26 DIAGNOSIS — I119 Hypertensive heart disease without heart failure: Secondary | ICD-10-CM | POA: Insufficient documentation

## 2020-12-26 DIAGNOSIS — I10 Essential (primary) hypertension: Secondary | ICD-10-CM | POA: Diagnosis present

## 2020-12-26 LAB — ECHOCARDIOGRAM COMPLETE
Area-P 1/2: 3.08 cm2
S' Lateral: 3.8 cm

## 2020-12-26 NOTE — Progress Notes (Signed)
Cardiology Office Note   Date:  12/28/2020   ID:  Christopher Bruce, DOB [birth year], MRN [id number]  PCP:  Cain Saupe, MD (Inactive)  Cardiologist: Dr. .Duke Salvia  No chief complaint on file.    History of Present Illness: Christopher Bruce is a 45 y.o. male who presents for ongoing assessment and management of hypertension, probable OSA, and morbid obesity.  In the past he was unable to undergo any cardiac testing due to lack of insurance.  However on 11/23/2020 he was seen in the office having insurance and requesting cardiac testing.  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  Other than some dyspnea on exertion he denies any chest pain.  At that office visit he was found to be hypertensive with a blood pressure of 178/78, EKG revealed first-degree AV block, left atrial enlargement with LVH and repolarization abnormality.  He was restarted on amlodipine/benazepril 10/40 mg daily and chlorthalidone 25 mg daily.  Depending upon his response to medication I will add spironolactone 25 mg daily as he had been on prior.  He was to take his blood pressures at home and bring them with him on follow-up.  An echocardiogram was also ordered and a sleep study at home was planned.  I followed up with some labs to have baseline levels of lipids, kidney function.  He was advised on weight loss and increased activity.  At the time of this office visit, sleep study has not been read.  Echocardiogram was completed on 12/26/2020.  This revealed severe concentric left ventricular hypertrophy with grade 1 diastolic dysfunction.  He had a normal EF of 60 to 65%.  There was aortic valve regurgitation which was trivial mitral valve was normal with trivial mitral valve regurgitation.   Labs are revealed, with a total cholesterol of 192, HDL 43, LDL 126.  Creatinine was 1.20.  LFTs were normal.   Today he comes feeling very well.  He has been medically compliant.  His blood pressure has responded very positively to  medication regimen and he is also lost 12 pounds.  He states that he is avoiding salted foods, taking his medications at the same time each day, and becoming more active.  He was not taking his blood pressure at home as we asked but he has noticed that he is feeling better.  He denies muscle cramps, palpitations, or fatigue.  Past Medical History:  Diagnosis Date  . Morbid obesity (HCC) 09/14/2019  . Snoring 01/09/2020    Past Surgical History:  Procedure Laterality Date  . CHOLECYSTECTOMY       Current Outpatient Medications  Medication Sig Dispense Refill  . amLODipine-benazepril (LOTREL) 10-40 MG capsule Take 1 capsule by mouth daily. 90 capsule 3  . chlorthalidone (HYGROTON) 25 MG tablet Take 1 tablet (25 mg total) by mouth daily. 90 tablet 3  . Multiple Vitamins-Minerals (MULTIVITAMIN WITH MINERALS) tablet Take 1 tablet by mouth daily.    Marland Kitchen spironolactone (ALDACTONE) 25 MG tablet Take 1 tablet (25 mg total) by mouth daily. 90 tablet 3   No current facility-administered medications for this visit.    Allergies:   Patient has no known allergies.    Social History:  The patient  reports that he has never smoked. He has never used smokeless tobacco. He reports current alcohol use. He reports that he does not use drugs.   Family History:  The patient's family history includes Appendicitis in his mother; HIV in his father.    ROS:  All other systems are reviewed and negative. Unless otherwise mentioned in H&P    PHYSICAL EXAM: VS:  BP 130/80   Pulse 66   Ht 6\' 9"  (2.057 m)   Wt (!) 376 lb 9.6 oz (170.8 kg)   SpO2 99%   BMI 40.36 kg/m  , BMI Body mass index is 40.36 kg/m. GEN: Well nourished, well developed, in no acute distress HEENT: normal Neck: no JVD, carotid bruits, or masses Cardiac: RRR;, distant heart sounds no murmurs, rubs, or gallops,no edema  Respiratory:  Clear to auscultation bilaterally, normal work of breathing GI: soft, nontender, nondistended, + BS MS:  no deformity or atrophy Skin: warm and dry, no rash Neuro:  Strength and sensation are intact Psych: euthymic mood, full affect   EKG: Not completed this office visit  Recent Labs: 11/23/2020: ALT 18; BUN 12; Creatinine, Ser 1.20; Potassium 4.0; Sodium 141    Lipid Panel    Component Value Date/Time   CHOL 192 11/23/2020 1530   TRIG 128 11/23/2020 1530   HDL 43 11/23/2020 1530   CHOLHDL 4.5 11/23/2020 1530   LDLCALC 126 (H) 11/23/2020 1530      Wt Readings from Last 3 Encounters:  12/28/20 (!) 376 lb 9.6 oz (170.8 kg)  12/20/20 (!) 370 lb (167.8 kg)  12/17/20 (!) 370 lb (167.8 kg)      Other studies Reviewed: Echocardiogram: 12/26/2020  1. Left ventricular ejection fraction, by estimation, is 60 to 65%. Left ventricular ejection fraction by PLAX is 61 %. The left ventricle has normal function. The left ventricle has no regional wall motion abnormalities. There is severe concentric left  ventricular hypertrophy. Left ventricular diastolic parameters are consistent with Grade I diastolic dysfunction (impaired relaxation).  2. Right ventricular systolic function is normal. The right ventricular size is normal.  3. The mitral valve is normal in structure. Trivial mitral valve regurgitation. No evidence of mitral stenosis.  4. The aortic valve is tricuspid. Aortic valve regurgitation is trivial. No aortic stenosis is present.  5. Aortic dilatation noted. There is mild dilatation of the ascending aorta, measuring 37 mm.  6. The inferior vena cava is normal in size with greater than 50% respiratory variability, suggesting right atrial pressure of 3 mmHg.  ASSESSMENT AND PLAN:  1.  Hypertension: Excellent response to restarting his medications.  I will hold off on spironolactone for now as he is only been taking the antihypertensives for about a month.  Optimal blood pressure should be 120/70 so we are getting closer.  He is also losing weight and avoiding salty foods.  I am going to  give him another few months to keep his blood pressure under control and then have him follow-up with Dr. 12/28/2020 with labs.     He is encouraged on continuing his lifestyle change, and medication regimen as he is getting significantly better readings on blood pressure.  I have also explained to him about his LVH and the need to avoid heart failure and worsening of his cardiomyopathy.  Further recommendations by Dr. Duke Salvia on follow-up concerning any more testing.  The patient is encouraged.  He wants to start playing softball and I have encouraged him to be more active including participating in this.  I explained to him that this is a slow process that he needs to be committed to in order to maintain blood pressure control and improvement of his overall health.  He verbalizes understanding.  2.  Obstructive sleep apnea: We have not yet heard the  results of the sleep study test.  We will follow-up on this concerning his results and call him.  RV size and pressure was not elevated.  3.  Obesity: He has lost 12 pounds with diet and on medication regimen.  He is committed to losing more weight.  I have encouraged him in this.  4.  Hypercholesterolemia: Goal of LDL less than 100.  Current LDL 126 on previous labs drawn before weight loss and blood pressure control.  We will redraw these in 3 months to evaluate for changes and may need to institute statin therapy if necessary.   Current medicines are reviewed at length with the patient today.  I have spent 25 minutes dedicated to the care of this patient on the date of this encounter to include pre-visit review of records, assessment, management and diagnostic testing,with shared decision making.  Labs/ tests ordered today include: None  Bettey Mare. Liborio Nixon, ANP, AACC   12/28/2020 3:40 PM    [facility] [address] Office [phone] Fax [phone]  Notice: This dictation was prepared with Dragon  dictation along with smaller phrase technology. Any transcriptional errors that result from this process are unintentional and may not be corrected upon review.

## 2020-12-26 NOTE — Progress Notes (Signed)
  Echocardiogram 2D Echocardiogram has been performed.  Christopher Bruce M 12/26/2020, 1:31 PM

## 2020-12-28 ENCOUNTER — Ambulatory Visit (INDEPENDENT_AMBULATORY_CARE_PROVIDER_SITE_OTHER): Payer: 59 | Admitting: Adult Health

## 2020-12-28 ENCOUNTER — Other Ambulatory Visit: Payer: Self-pay

## 2020-12-28 ENCOUNTER — Encounter: Payer: Self-pay | Admitting: Adult Health

## 2020-12-28 VITALS — BP 130/80 | HR 66 | Ht >= 80 in | Wt 376.6 lb

## 2020-12-28 DIAGNOSIS — E7800 Pure hypercholesterolemia, unspecified: Secondary | ICD-10-CM

## 2020-12-28 DIAGNOSIS — I517 Cardiomegaly: Secondary | ICD-10-CM

## 2020-12-28 DIAGNOSIS — I10 Essential (primary) hypertension: Secondary | ICD-10-CM | POA: Diagnosis not present

## 2020-12-28 NOTE — Patient Instructions (Signed)
Medication Instructions:  No Changes *If you need a refill on your cardiac medications before your next appointment, please call your pharmacy*   Lab Work: No Labs If you have labs (blood work) drawn today and your tests are completely normal, you will receive your results only by: Marland Kitchen MyChart Message (if you have MyChart) OR . A paper copy in the mail If you have any lab test that is abnormal or we need to change your treatment, we will call you to review the results.   Testing/Procedures: No Testing   Follow-Up: At [facility], you and your health needs are our priority.  As part of our continuing mission to provide you with exceptional heart care, we have created designated Provider Care Teams.  These Care Teams include your primary Cardiologist (physician) and Advanced Practice Providers (APPs -  Physician Assistants and Nurse Practitioners) who all work together to provide you with the care you need, when you need it.      Your next appointment:   3 month(s)  The format for your next appointment:   In Person  Provider:   Chilton Si, MD

## 2021-01-06 ENCOUNTER — Encounter (HOSPITAL_BASED_OUTPATIENT_CLINIC_OR_DEPARTMENT_OTHER): Payer: Self-pay | Admitting: Cardiovascular Disease

## 2021-01-06 NOTE — Procedures (Signed)
     Patient Name: Christopher Bruce, Christopher Bruce Date: 12/23/2020 Gender: Male D.O.B: 08-11-1975 Age (years): 30 Referring Provider: Joni Reining NP Height (inches): 81 Interpreting Physician: Nicki Guadalajara MD, ABSM Weight (lbs): 370 RPSGT: Bethel Sink BMI: 40 MRN: [id number] Neck Size: 19.00  CLINICAL INFORMATION Sleep Study Type: HST  Indication for sleep study: snoring  Epworth Sleepiness Score: 2  SLEEP STUDY TECHNIQUE A multi-channel overnight portable sleep study was performed. The channels recorded were: nasal airflow, thoracic respiratory movement, and oxygen saturation with a pulse oximetry. Snoring was also monitored.  MEDICATIONS amLODipine-benazepril (LOTREL) 10-40 MG capsule chlorthalidone (HYGROTON) 25 MG tablet Multiple Vitamins-Minerals (MULTIVITAMIN WITH MINERALS) tablet spironolactone (ALDACTONE) 25 MG tablet Patient self administered medications include: N/A.  SLEEP ARCHITECTURE Patient was studied for 355.2 minutes. The sleep efficiency was 100.0 % and the patient was supine for 0%. The arousal index was 0.0 per hour.  RESPIRATORY PARAMETERS The overall AHI was 37.3 per hour, with a central apnea index of 0 per hour.  The oxygen nadir was 78% during sleep.  CARDIAC DATA Mean heart rate during sleep was 68.9 bpm.  IMPRESSIONS - Severe obstructive sleep apnea occurred during this study (AHI 37.3/h). The severity during REM sleep cannot be assessed on this home study. - Severe oxygen desaturation to a nadir of 78%. - Patient snored 2.9% during the sleep.  DIAGNOSIS - Obstructive Sleep Apnea (G47.33) - Nocturnal Hypoxemia (G47.36)  RECOMMENDATIONS - Recommend a therapeutic CPAP titration study to further evaluate and treat his severe sleep disorded breathing. If unable to perform an in-lab titration, initiate Auto PAP with EPR of 3 at 7 - 20 cm of water. - Effort should be made to optimize nasal and oropharyngeal patency.  - Avoid alcohol,  sedatives and other CNS depressants that may worsen sleep apnea and disrupt normal sleep architecture. - Sleep hygiene should be reviewed to assess factors that may improve sleep quality. - Weight management and regular exercise should be initiated or continued. - Return to Sleep Center to discuss the results of this study - Patient may benefit from in-lab study  [Electronically signed] 01/06/2021 09:22 PM  Nicki Guadalajara MD, [facility], ABSM Diplomate, American Board of Sleep Medicine   NPI: [id number] [facility] PH: [phone]   FX: [phone] ACCREDITED BY THE AMERICAN ACADEMY OF SLEEP MEDICINE

## 2021-01-11 ENCOUNTER — Encounter: Payer: Self-pay | Admitting: Adult Health

## 2021-01-11 ENCOUNTER — Telehealth: Payer: Self-pay | Admitting: *Deleted

## 2021-01-11 NOTE — Telephone Encounter (Signed)
Patient was notified of sleep study results and recommendations  by Max Fickle, CMA. He does not want to have titration study due to time restrictions. 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 Patient wants APAP ordered instead. APAP order has been sent to [facility] to process ASAP.

## 2021-01-30 ENCOUNTER — Telehealth: Payer: Self-pay | Admitting: *Deleted

## 2021-01-30 NOTE — Telephone Encounter (Signed)
Staff message sent to Garald Braver, Dr [facility] scheduler patient needs to be scheduled for a 90 day sleep compliance visit. CPAP set up was on 01/28/21.

## 2021-03-25 ENCOUNTER — Other Ambulatory Visit: Payer: Self-pay | Admitting: *Deleted

## 2021-03-25 MED ORDER — CHLORTHALIDONE 25 MG PO TABS: 25.0000 mg | ORAL_TABLET | Freq: Every day | ORAL | 2 refills | Status: DC

## 2021-03-25 MED ORDER — AMLODIPINE BESY-BENAZEPRIL HCL 10-40 MG PO CAPS
1.0000 | ORAL_CAPSULE | Freq: Every day | ORAL | 2 refills | Status: DC
Start: 2021-03-25 — End: 2021-09-04

## 2021-03-25 NOTE — Telephone Encounter (Signed)
Rx(s) sent to pharmacy electronically.  

## 2021-04-05 ENCOUNTER — Ambulatory Visit (HOSPITAL_BASED_OUTPATIENT_CLINIC_OR_DEPARTMENT_OTHER): Payer: 59 | Admitting: Cardiovascular Disease

## 2021-04-05 NOTE — Progress Notes (Incomplete)
Hypertension Clinic Initial Assessment:    Date:  04/05/2021   ID:  Christopher Bruce, DOB Jul 11, 1976, MRN [id number]  PCP:  Cain Saupe, MD (Inactive)  Cardiologist:  None  Nephrologist:  Referring MD: No ref. provider found   CC: Hypertension  History of Present Illness:    Christopher Bruce is a 45 y.o. male with a hx of hypertension and obestiy here for follow up.  He established care in the advanced hypertension clinic 09/2019.  He reports that he was first diagnosed with hypertension at around 36.  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  He followed up with the doctor regularly and his BP was well-controlled on amlodipine 10 mg.  Since moving to [place] several years ago, he hasn't had insurance and hasn't been on medicine.  He went to the dentist three weeks ago and his BP was elevated to over 200 mmHg systolic.  He was told that his BP had to be controlled to have his dental procedure.  At his first appointment amlodipine was restarted.  He was referred for sleep study but has not been able to be patient due to lack of insurance.  He has been trying to eat healthier and plans to start the keto diet.  His wife recently completed treatment for ovarian cancer and is cancer free.  They are looking to start getting more active. Chlorthalidone was added 10/2019 and benazepril 12/2019.  He has not noted significant change in his blood pressure.  It is running mostly in the 160-170/90s.  He was given a blood pressure cuff but it is not measuring his upper arm accurately.  They mostly cook at home and he tries to limit his sodium intake.  He takes his medications daily.  Previous antihypertensives: Amlodipine   Past Medical History:  Diagnosis Date   Morbid obesity (HCC) 09/14/2019   Snoring 01/09/2020    Past Surgical History:  Procedure Laterality Date   CHOLECYSTECTOMY      Current Medications: No outpatient medications have been marked as taking for the 04/05/21  encounter (Appointment) with Chilton Si, MD.     Allergies:   Patient has no known allergies.   Social History   Socioeconomic History   Marital status: Single    Spouse name: Not on file   Number of children: Not on file   Years of education: Not on file   Highest education level: Not on file  Occupational History   Not on file  Tobacco Use   Smoking status: Never   Smokeless tobacco: Never  Substance and Sexual Activity   Alcohol use: Yes   Drug use: No   Sexual activity: Not on file  Other Topics Concern   Not on file  Social History Narrative   Not on file   Social Determinants of Health   Financial Resource Strain: Not on file  Food Insecurity: Not on file  Transportation Needs: Not on file  Physical Activity: Not on file  Stress: Not on file  Social Connections: Not on file     Family History: The patient's family history includes Appendicitis in his mother; HIV in his father.  ROS:   Please see the history of present illness.     All other systems reviewed and are negative.  EKGs/Labs/Other Studies Reviewed:    EKG:  EKG is ordered today.  The ekg ordered today demonstrates sinus rhythm.  Rate 92 bpm.  LAD.  Incomplete LBBB.  LVH  with repolarization abnormalities.   Recent Labs: 11/23/2020: ALT 18; BUN 12; Creatinine, Ser 1.20; Potassium 4.0; Sodium 141   Recent Lipid Panel    Component Value Date/Time   CHOL 192 11/23/2020 1530   TRIG 128 11/23/2020 1530   HDL 43 11/23/2020 1530   CHOLHDL 4.5 11/23/2020 1530   LDLCALC 126 (H) 11/23/2020 1530    Physical Exam:     Wt Readings from Last 3 Encounters:  12/28/20 (!) 376 lb 9.6 oz (170.8 kg)  12/20/20 (!) 370 lb (167.8 kg)  12/17/20 (!) 370 lb (167.8 kg)  VS:  There were no vitals taken for this visit. , BMI There is no height or weight on file to calculate BMI. GENERAL:  Well appearing HEENT: Pupils equal round and reactive, fundi not visualized, oral mucosa unremarkable NECK:  No  jugular venous distention, waveform within normal limits, carotid upstroke brisk and symmetric, no bruits LUNGS:  Clear to auscultation bilaterally HEART:  RRR.  PMI not displaced or sustained,S1 and S2 within normal limits, no S3, no S4, no clicks, no rubs, no murmurs ABD:  Flat, positive bowel sounds normal in frequency in pitch, no bruits, no rebound, no guarding, no midline pulsatile mass, no hepatomegaly, no splenomegaly EXT:  2 plus pulses throughout, no edema, no cyanosis no clubbing SKIN:  No rashes no nodules NEURO:  Cranial nerves II through XII grossly intact, motor grossly intact throughout PSYCH:  Cognitively intact, oriented to person place and time  ASSESSMENT:    No diagnosis found.   PLAN:    # Hypertension:  BP remains poorly controlled on multiple agents.  He is going to bring his home blood pressure cuff to the office and check it on his forearm to see if that is accurate.  We discussed looking for secondary causes including checking serum renin and aldosterone.  We also discussed sleep study and renal Dopplers.  However given that he does not have insurance right now he is not interested in doing these things.  We will start spironolactone 25 mg daily and check a BMP in 1 week.  He is very excited to start back exercising and this was encouraged.  He plans to start doing the keto diet with his wife.  # Possible OSA: He has snoring and apneic episodes.  Will need a sleep study when able.  # Morbid Obesity:  Exercise as above.    Disposition:    FU with MD in 4 months.  PharmD in 1 month.   Medication Adjustments/Labs and Tests Ordered: Current medicines are reviewed at length with the patient today.  Concerns regarding medicines are outlined above.  No orders of the defined types were placed in this encounter.  No orders of the defined types were placed in this encounter.    Guadalupe Maple  04/05/2021 7:42 AM    [facility]

## 2021-04-11 ENCOUNTER — Other Ambulatory Visit: Payer: Self-pay

## 2021-04-11 ENCOUNTER — Ambulatory Visit (INDEPENDENT_AMBULATORY_CARE_PROVIDER_SITE_OTHER): Payer: 59 | Admitting: Cardiovascular Disease

## 2021-04-11 ENCOUNTER — Encounter: Payer: Self-pay | Admitting: Cardiovascular Disease

## 2021-04-11 DIAGNOSIS — I10 Essential (primary) hypertension: Secondary | ICD-10-CM

## 2021-04-11 DIAGNOSIS — G4734 Idiopathic sleep related nonobstructive alveolar hypoventilation: Secondary | ICD-10-CM

## 2021-04-11 DIAGNOSIS — G4733 Obstructive sleep apnea (adult) (pediatric): Secondary | ICD-10-CM

## 2021-04-11 DIAGNOSIS — I517 Cardiomegaly: Secondary | ICD-10-CM | POA: Diagnosis not present

## 2021-04-11 NOTE — Patient Instructions (Signed)
Medication Instructions:  The current medical regimen is effective;  continue present plan and medications.  *If you need a refill on your cardiac medications before your next appointment, please call your pharmacy*   Follow-Up: At [facility], you and your health needs are our priority.  As part of our continuing mission to provide you with exceptional heart care, we have created designated Provider Care Teams.  These Care Teams include your primary Cardiologist (physician) and Advanced Practice Providers (APPs -  Physician Assistants and Nurse Practitioners) who all work together to provide you with the care you need, when you need it.  We recommend signing up for the patient portal called "MyChart".  Sign up information is provided on this After Visit Summary.  MyChart is used to connect with patients for Virtual Visits (Telemedicine).  Patients are able to view lab/test results, encounter notes, upcoming appointments, etc.  Non-urgent messages can be sent to your provider as well.   To learn more about what you can do with MyChart, go to [email or web address].    Your next appointment:   6 month(s)  The format for your next appointment:   In Person  Provider:   Thomas Kelly, MD    

## 2021-04-11 NOTE — Progress Notes (Signed)
Cardiology Office Note    Date:  04/11/2021   ID:  [name], DOB 02-Nov-1975, MRN [id number]  PCP:  Christopher Blackbird, MD (Inactive)  Cardiologist:  Christopher Majestic, MD (sleep); Christopher Bruce  New sleep evaluation.  History of Present Illness:  Christopher Bruce is a 45 y.o. male who presents for initial sleep evaluation following of CPAP therapy.  Christopher Bruce is followed by Christopher Bruce for primary cardiology care.  He is originally from [place], [place], has a history of hypertension and significant obesity.  Most recently he has been on hypertensive regimen consisting of amlodipine/benazepril 10/40, chlorthalidone 25 mg daily, and spironolactone 25 mg daily.  Due to significant concerns for sleep apnea including snoring and his morbid obesity with some fatigability he was referred for a home sleep study.  This was done on Dec 20, 2020 and he was found to have severe obstructive sleep apnea with an AHI of 37.3/h.  There was severe oxygen desaturation to a nadir of 78%.  He had mild snoring for short duration during sleep.  He was not approved for an in lab titration and ultimately underwent AutoPap set up on January 28, 2021 [facility] as his DME company.  He received a ResMed air sense 11 CPAP machine which currently has been set at a pressure range of 7 to 20 cm of water.  A download was obtained from August 9 through April 10, 2021 which showed 77% of the nights with usage, but he was not being compliant with usage greater than 4 hours at only 47%.  His average use on days used was 4 hours and 19 minutes.  His 95th percentile pressure is 14.3 with maximum average pressure of 15.5 cm of water.  Most days he did not have a leak with the exception of 2 days leak was increased.  AHI was excellent at 0.9.  Since initiating CPAP therapy has noticed that he is sleeping better and longer.  He denies any recent residual daytime sleepiness and acute Epworth Sleepiness Scale score endorsed at  4 arguing against daytime sleepiness on therapy.  He denies any awareness of restless legs, bruxism, hypnagogic hallucinations or cataplectic events.  An echo Doppler study in May 2022 showed severe centric left ventricular hypertrophy with EF at 60 to 65%.  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  Presently, he works third shift driving a Surveyor, quantity.  On some days when he drives to [facility] he is able to get home relatively early and go to bed by- 10 o'clock but on days he drives to [place] he often does not get to bed till noon and typically had been waking up at 4:00 in the afternoon.  However since starting CPAP therapy he feels that he is able to sleep a little bit longer.  He admits to significant weight gain since 2016 and has gained pounds.  He presents for initial evaluation.   Past Medical History:  Diagnosis Date   Morbid obesity ([place]) 09/14/2019   Snoring 01/09/2020    Past Surgical History:  Procedure Laterality Date   CHOLECYSTECTOMY      Current Medications: Outpatient Medications Prior to Visit  Medication Sig Dispense Refill   amLODipine-benazepril (LOTREL) 10-40 MG capsule Take 1 capsule by mouth daily. 90 capsule 2   chlorthalidone (HYGROTON) 25 MG tablet Take 1 tablet (25 mg total) by mouth daily. 90 tablet 2   Multiple Vitamins-Minerals (MULTIVITAMIN WITH MINERALS) tablet Take 1 tablet by  mouth daily. (Patient not taking: Reported on 04/11/2021)     spironolactone (ALDACTONE) 25 MG tablet Take 1 tablet (25 mg total) by mouth daily. 90 tablet 3   No facility-administered medications prior to visit.     Allergies:   Patient has no known allergies.   Social History   Socioeconomic History   Marital status: Single    Spouse name: Not on file   Number of children: Not on file   Years of education: Not on file   Highest education level: Not on file  Occupational History   Not on file  Tobacco Use   Smoking status: Never   Smokeless tobacco: Never   Substance and Sexual Activity   Alcohol use: Yes   Drug use: No   Sexual activity: Not on file  Other Topics Concern   Not on file  Social History Narrative   Not on file   Social Determinants of Health   Financial Resource Strain: Not on file  Food Insecurity: Not on file  Transportation Needs: Not on file  Physical Activity: Not on file  Stress: Not on file  Social Connections: Not on file    Additional social history is notable that he was born in [place], [place].  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  He moved to [place].  He is the twin brother of the father of children of one of our employees.  He has 1 son who lives in [place].  He does not exercise regularly.  Family History:  The patient's family history includes Appendicitis in his mother; HIV in his father.  Is father is deceased and had HIV dying in his 64s.  Mother is alive at 60.  He has 1 twin brother and 1 older brother.  ROS General: Negative; No fevers, chills, or night sweats; 70 pound weight gain since 2016 HEENT: Negative; No changes in vision or hearing, sinus congestion, difficulty swallowing Pulmonary: Negative; No cough, wheezing, shortness of breath, hemoptysis Cardiovascular: Positive for hypertension and hyperlipidemia GI: Negative; No nausea, vomiting, diarrhea, or abdominal pain GU: Negative; No dysuria, hematuria, or difficulty voiding Musculoskeletal: Negative; no myalgias, joint pain, or weakness Hematologic/Oncology: Negative; no easy bruising, bleeding Endocrine: Negative; no heat/cold intolerance; no diabetes Neuro: Negative; no changes in balance, headaches Skin: Negative; No rashes or skin lesions Psychiatric: Negative; No behavioral problems, depression Sleep: See HPI Other comprehensive 14 point system review is negative.   PHYSICAL EXAM:   VS:  BP 130/82   Pulse 60   Ht 6' 9"  (2.057 m)   Wt (!) 379 lb 3.2 oz (172 kg)   SpO2 92%   BMI 40.64 kg/m      Repeat blood pressure by me was 126/80.  Wt Readings from Last 3 Encounters:  04/11/21 (!) 379 lb 3.2 oz (172 kg)  12/28/20 (!) 376 lb 9.6 oz (170.8 kg)  12/20/20 (!) 370 lb (167.8 kg)    General: Alert, oriented, no distress.  6 feet 9 inches tall Skin: normal turgor, no rashes, warm and dry HEENT: Normocephalic, atraumatic. Pupils equal round and reactive to light; sclera anicteric; extraocular muscles intact;  Nose without nasal septal hypertrophy Mouth/Parynx benign; Mallinpatti scale 3/4 Neck: No JVD, no carotid bruits; normal carotid upstroke Lungs: clear to ausculatation and percussion; no wheezing or rales Chest wall: without tenderness to palpitation Heart: PMI not displaced, RRR, s1 s2 normal, 1/6 systolic murmur, no diastolic murmur, no rubs, gallops, thrills, or heaves Abdomen:  soft, nontender; no hepatosplenomehaly, BS+; abdominal aorta nontender and not dilated by palpation. Back: no CVA tenderness Pulses 2+ Musculoskeletal: full range of motion, normal strength, no joint deformities Extremities: no clubbing cyanosis or edema, Homan's sign negative; size 15 shoe Neurologic: grossly nonfocal; Cranial nerves grossly wnl Psychologic: Normal mood and affect   Studies/Labs Reviewed:   EKG:  EKG is ordered today. ECG (independently read by me): Normal sinus rhythm at 60 bpm, first-degree AV block with a PR interval at 2 2 6  ms; left ventricular hypertrophy with repolarization changes.  Lateral T wave abnormality   Recent Labs: BMP Latest Ref Rng & Units 11/23/2020 11/10/2019 09/14/2019  Glucose 65 - 99 mg/dL 88 78 84  BUN 6 - 24 mg/dL 12 12 9   Creatinine 0.76 - 1.27 mg/dL 1.20 1.09 1.10  BUN/Creat Ratio 9 - 20 10 11  8(L)  Sodium 134 - 144 mmol/L 141 143 142  Potassium 3.5 - 5.2 mmol/L 4.0 3.9 4.0  Chloride 96 - 106 mmol/L 102 102 104  CO2 20 - 29 mmol/L 23 26 25   Calcium 8.7 - 10.2 mg/dL 9.3 9.4 9.3     Hepatic Function Latest Ref Rng & Units 11/23/2020 09/14/2019  02/21/2014  Total Protein 6.0 - 8.5 g/dL 6.8 6.5 7.5  Albumin 4.0 - 5.0 g/dL 4.2 4.4 4.6  AST 0 - 40 IU/L 18 17 24   ALT 0 - 44 IU/L 18 16 18   Alk Phosphatase 44 - 121 IU/L 89 88 81  Total Bilirubin 0.0 - 1.2 mg/dL 0.4 0.4 1.2  Bilirubin, Direct 0.00 - 0.40 mg/dL 0.14 - -    CBC Latest Ref Rng & Units 02/21/2014  WBC 4.0 - 10.5 K/uL 13.0(H)  Hemoglobin 13.0 - 17.0 g/dL 16.5  Hematocrit 39.0 - 52.0 % 48.1  Platelets 150 - 400 K/uL 199   Lab Results  Component Value Date   MCV 85.6 02/21/2014   No results found for: TSH Lab Results  Component Value Date   HGBA1C 5.0 09/14/2019     BNP No results found for: BNP  ProBNP No results found for: PROBNP   Lipid Panel     Component Value Date/Time   CHOL 192 11/23/2020 1530   TRIG 128 11/23/2020 1530   HDL 43 11/23/2020 1530   CHOLHDL 4.5 11/23/2020 1530   LDLCALC 126 (H) 11/23/2020 1530   LABVLDL 23 11/23/2020 1530     RADIOLOGY: No results found.   Additional studies/ records that were reviewed today include:    12/20/2020 CLINICAL INFORMATION Sleep Study Type: HST   Indication for sleep study: snoring   Epworth Sleepiness Score: 2   SLEEP STUDY TECHNIQUE A multi-channel overnight portable sleep study was performed. The channels recorded were: nasal airflow, thoracic respiratory movement, and oxygen saturation with a pulse oximetry. Snoring was also monitored.   MEDICATIONS amLODipine-benazepril (LOTREL) 10-40 MG capsule chlorthalidone (HYGROTON) 25 MG tablet Multiple Vitamins-Minerals (MULTIVITAMIN WITH MINERALS) tablet spironolactone (ALDACTONE) 25 MG tablet Patient self administered medications include: N/A.   SLEEP ARCHITECTURE Patient was studied for 355.2 minutes. The sleep efficiency was 100.0 % and the patient was supine for 0%. The arousal index was 0.0 per hour.   RESPIRATORY PARAMETERS The overall AHI was 37.3 per hour, with a central apnea index of 0 per hour.   The oxygen nadir was 78%  during sleep.   CARDIAC DATA Mean heart rate during sleep was 68.9 bpm.   IMPRESSIONS - Severe obstructive sleep apnea occurred during this study (AHI 37.3/h). The  severity during REM sleep cannot be assessed on this home study. - Severe oxygen desaturation to a nadir of 78%. - Patient snored 2.9% during the sleep.   DIAGNOSIS - Obstructive Sleep Apnea (G47.33) - Nocturnal Hypoxemia (G47.36)   RECOMMENDATIONS - Recommend a therapeutic CPAP titration study to further evaluate and treat his severe sleep disorded breathing. If unable to perform an in-lab titration, initiate Auto PAP with EPR of 3 at 7 - 20 cm of water. - Effort should be made to optimize nasal and oropharyngeal patency.  - Avoid alcohol, sedatives and other CNS depressants that may worsen sleep apnea and disrupt normal sleep architecture. - Sleep hygiene should be reviewed to assess factors that may improve sleep quality. - Weight management and regular exercise should be initiated or continued. - Return to Sleep Center to discuss the results of this study - Patient may benefit from in-lab study   ASSESSMENT:    1. OSA (obstructive sleep apnea)   2. Essential hypertension   3. Left ventricular hypertrophy   4. Nocturnal hypoxemia   5. Morbid obesity [facility])     PLAN:  Mr. Sakib Bruce is a 45 year old gentleman who has a history of hypertension, morbid obesity, and is followed by Christopher Bruce.  The patient has a history of poor sleep with previous inability to sleep on his back as well as a history of mild snoring and nonrestorative sleep.  He was referred for home sleep study which revealed severe overall sleep apnea with an AHI of 37.3/h and significant oxygen desaturation to a nadir of 78%.  On January 28, 2021 CPAP auto therapy was initiated with a ResMed air sense 11 AutoSet unit with initial pressure set at a minimum of 7 and maximum of 20 cm of water.  With therapy, AHI is now excellent at 0.9.  He is  meeting compliance standards with reference to usage days but is not compliant with reference to usage greater than 4 hours.  On average she is sleeping with CPAP only 4 hours and 19 minutes.  Some of this is contributed by his working the third shift and that at times he does not get home from work until 11:00 in the morning.  Often he goes to sleep by noon but previously was waking up around 4 or 5 in the afternoon.  Since he has been on CPAP he has noticed he is sleeping a little bit longer.  I had a lengthy discussion with him today.  I reviewed normal sleep architecture and the potential disruption to normal sleep arc contracture if sleep apnea is present.  In addition, I reviewed its effect on blood pressure control particularly as result of apnea or hypopnea events contributing to increased arousals which is increasing his sympathetic tone and contributing to blood pressure difficulty with inability to obtain the typical 20 mm diastolic dip typically seen with normal sleep I also discussed potential effects on nocturnal arrhythmias creased risk for atrial fibrillation.  With his significant nocturnal hypoxemia I discussed risks of potential coronary and/or cerebrovascular ischemia.  I discussed its effects on increasing nocturia.  I discussed with him optimal sleep duration for an adult at 7 to 9 hours.  He should try to at least achieve 7 hours if at all possible.  In order to meet insurance compliance requirements, he needs to have at least 70% of the nights with usage greater than 4 hours.  However I have strongly recommended that he use CPAP for the entire nights  duration.  If he goes to the bathroom, he is to put his mask on and turn his machine back on prior to going to bed which will allow him to have improved sleep particularly during the preponderance of rem sleep which occurs during the second half of the night.  I discussed the importance of weight loss and increased exercise.  He has gained over  70 pounds over the last 6 years.  Since his set up date was January 28, 2021, he needs to meet compliance by the end of September.  As long as he is stable I will see him in 1 year for reevaluation or sooner as needed.  Time spent 40 minutes  Medication Adjustments/Labs and Tests Ordered: Current medicines are reviewed at length with the patient today.  Concerns regarding medicines are outlined above.  Medication changes, Labs and Tests ordered today are listed in the Patient Instructions below. Patient Instructions  Medication Instructions:  The current medical regimen is effective;  continue present plan and medications.  *If you need a refill on your cardiac medications before your next appointment, please call your pharmacy*   Follow-Up: At [facility], you and your health needs are our priority.  As part of our continuing mission to provide you with exceptional heart care, we have created designated Provider Care Teams.  These Care Teams include your primary Cardiologist (physician) and Advanced Practice Providers (APPs -  Physician Assistants and Nurse Practitioners) who all work together to provide you with the care you need, when you need it.  We recommend signing up for the patient portal called "MyChart".  Sign up information is provided on this After Visit Summary.  MyChart is used to connect with patients for Virtual Visits (Telemedicine).  Patients are able to view lab/test results, encounter notes, upcoming appointments, etc.  Non-urgent messages can be sent to your provider as well.   To learn more about what you can do with MyChart, go to [email or web address].    Your next appointment:   6 month(s)  The format for your next appointment:   In Person  Provider:   Shelva Majestic, MD      Signed, Christopher Majestic, MD  04/11/2021 5:57 PM    [place] [address] [place], [place], [place]  [address] Phone: [phone]

## 2021-04-25 ENCOUNTER — Encounter (HOSPITAL_BASED_OUTPATIENT_CLINIC_OR_DEPARTMENT_OTHER): Payer: Self-pay

## 2021-04-25 ENCOUNTER — Ambulatory Visit (HOSPITAL_BASED_OUTPATIENT_CLINIC_OR_DEPARTMENT_OTHER): Payer: 59 | Admitting: Cardiovascular Disease

## 2021-04-25 NOTE — Progress Notes (Incomplete)
Advanced Hypertension Clinic Follow-up:    Date:  04/25/2021   ID:  Christopher Bruce, DOB 1976/06/01, MRN [id number]  PCP:  Cain Saupe, MD (Inactive)  Cardiologist:  None  Nephrologist:  Referring MD: No ref. provider found   CC: Hypertension  History of Present Illness:    Christopher Bruce is a 45 y.o. male with a hx of hypertension and obestiy here for follow up.  He established care in the advanced hypertension clinic 09/2019.  He reports that he was first diagnosed with hypertension at around 36.  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  He followed up with the doctor regularly and his BP was well-controlled on amlodipine 10 mg.  Since moving to [place] several years ago, he hasn't had insurance and hasn't been on medicine.  He went to the dentist and his BP was elevated to over 200 mmHg systolic.  He was told that his BP had to be controlled to have his dental procedure.  At his first appointment amlodipine was restarted.  He was referred for sleep study but has not been able to be patient due to lack of insurance.  He had been trying to eat healthier and planned to start the keto diet.  His wife completed treatment for ovarian cancer and is cancer free.  They were looking to start being more active. Chlorthalidone was added 10/2019 and benazepril 12/2019.  He had not noted significant change in his blood pressure.  It was running mostly in the 160-170/90s.  He was given a blood pressure cuff but it was not measuring his upper arm accurately.  They mostly cook at home and he tries to limit his sodium intake.  He takes his medications daily.  Today,  He denies any palpitations, chest pain, or shortness of breath. No lightheadedness, headaches, syncope, orthopnea, or PND. Also has no lower extremity edema or exertional symptoms.   Previous antihypertensives: Amlodipine   Past Medical History:  Diagnosis Date   Morbid obesity (HCC) 09/14/2019   Snoring 01/09/2020     Past Surgical History:  Procedure Laterality Date   CHOLECYSTECTOMY      Current Medications: No outpatient medications have been marked as taking for the 04/25/21 encounter (Appointment) with Chilton Si, MD.     Allergies:   Patient has no known allergies.   Social History   Socioeconomic History   Marital status: Single    Spouse name: Not on file   Number of children: Not on file   Years of education: Not on file   Highest education level: Not on file  Occupational History   Not on file  Tobacco Use   Smoking status: Never   Smokeless tobacco: Never  Substance and Sexual Activity   Alcohol use: Yes   Drug use: No   Sexual activity: Not on file  Other Topics Concern   Not on file  Social History Narrative   Not on file   Social Determinants of Health   Financial Resource Strain: Not on file  Food Insecurity: Not on file  Transportation Needs: Not on file  Physical Activity: Not on file  Stress: Not on file  Social Connections: Not on file     Family History: The patient's family history includes Appendicitis in his mother; HIV in his father.  ROS:   Please see the history of present illness.  (+)   All other systems reviewed and are negative.  EKGs/Labs/Other Studies Reviewed:  EKG:   04/25/2021: Sinus ***. Rate *** bpm. 01/09/2020: sinus rhythm.  Rate 92 bpm.  LAD.  Incomplete LBBB.  LVH with repolarization abnormalities.   Echo 12/26/2020:  1. Left ventricular ejection fraction, by estimation, is 60 to 65%. Left  ventricular ejection fraction by PLAX is 61 %. The left ventricle has  normal function. The left ventricle has no regional wall motion  abnormalities. There is severe concentric left   ventricular hypertrophy. Left ventricular diastolic parameters are  consistent with Grade I diastolic dysfunction (impaired relaxation).   2. Right ventricular systolic function is normal. The right ventricular  size is normal.   3. The mitral  valve is normal in structure. Trivial mitral valve  regurgitation. No evidence of mitral stenosis.   4. The aortic valve is tricuspid. Aortic valve regurgitation is trivial.  No aortic stenosis is present.   5. Aortic dilatation noted. There is mild dilatation of the ascending  aorta, measuring 37 mm.   6. The inferior vena cava is normal in size with greater than 50%  respiratory variability, suggesting right atrial pressure of 3 mmHg.   Recent Labs: 11/23/2020: ALT 18; BUN 12; Creatinine, Ser 1.20; Potassium 4.0; Sodium 141   Recent Lipid Panel    Component Value Date/Time   CHOL 192 11/23/2020 1530   TRIG 128 11/23/2020 1530   HDL 43 11/23/2020 1530   CHOLHDL 4.5 11/23/2020 1530   LDLCALC 126 (H) 11/23/2020 1530    Physical Exam:    Wt Readings from Last 3 Encounters:  04/11/21 (!) 379 lb 3.2 oz (172 kg)  12/28/20 (!) 376 lb 9.6 oz (170.8 kg)  12/20/20 (!) 370 lb (167.8 kg)   VS:  There were no vitals taken for this visit. , BMI There is no height or weight on file to calculate BMI. GENERAL:  Well appearing HEENT: Pupils equal round and reactive, fundi not visualized, oral mucosa unremarkable NECK:  No jugular venous distention, waveform within normal limits, carotid upstroke brisk and symmetric, no bruits LUNGS:  Clear to auscultation bilaterally HEART:  RRR.  PMI not displaced or sustained,S1 and S2 within normal limits, no S3, no S4, no clicks, no rubs, no murmurs ABD:  Flat, positive bowel sounds normal in frequency in pitch, no bruits, no rebound, no guarding, no midline pulsatile mass, no hepatomegaly, no splenomegaly EXT:  2 plus pulses throughout, no edema, no cyanosis no clubbing SKIN:  No rashes no nodules NEURO:  Cranial nerves II through XII grossly intact, motor grossly intact throughout PSYCH:  Cognitively intact, oriented to person place and time  ASSESSMENT:    No diagnosis found.   PLAN:   No problem-specific Assessment & Plan notes found for this  encounter.  # Hypertension:  BP remains poorly controlled on multiple agents.  He is going to bring his home blood pressure cuff to the office and check it on his forearm to see if that is accurate.  We discussed looking for secondary causes including checking serum renin and aldosterone.  We also discussed sleep study and renal Dopplers.  However given that he does not have insurance right now he is not interested in doing these things.  We will start spironolactone 25 mg daily and check a BMP in 1 week.  He is very excited to start back exercising and this was encouraged.  He plans to start doing the keto diet with his wife.  # Possible OSA: He has snoring and apneic episodes.  Will need a sleep study  when able.  # Morbid Obesity:  Exercise as above.    Disposition:    FU with MD in 4 months.  PharmD in 1 month.***   Medication Adjustments/Labs and Tests Ordered: Current medicines are reviewed at length with the patient today.  Concerns regarding medicines are outlined above.   No orders of the defined types were placed in this encounter.  No orders of the defined types were placed in this encounter.  I,Mathew Stumpf,acting as a Neurosurgeon for Chilton Si, MD.,have documented all relevant documentation on the behalf of Chilton Si, MD,as directed by  Chilton Si, MD while in the presence of Chilton Si, MD.  ***  Signed, Carlena Bjornstad  04/25/2021 8:17 AM    [facility]

## 2021-09-02 NOTE — Progress Notes (Incomplete)
Hypertension Clinic Initial Assessment:    Date:  09/04/2021   ID:  Christopher Bruce, DOB 1975-09-11, MRN [id number]  PCP:  Cain Saupe, MD (Inactive)  Cardiologist:  None  Nephrologist:  Referring MD: No ref. provider found   CC: Hypertension  History of Present Illness:    Christopher Bruce is a 46 y.o. male with a hx of hypertension and obestiy here for follow up.  He established care in the advanced hypertension clinic 09/2019.  He reports that he was first diagnosed with hypertension at around 36.  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  He followed up with the doctor regularly and his BP was well-controlled on amlodipine 10 mg.  Since moving to [place] several years ago, he hasn't had insurance and hasn't been on medicine.  He went to the dentist three weeks ago and his BP was elevated to over 200 mmHg systolic.  He was told that his BP had to be controlled to have his dental procedure.  At his first appointment amlodipine was restarted.  He was referred for sleep study but has not been able to be patient due to lack of insurance.  He has been trying to eat healthier and plans to start the keto diet.  His wife recently completed treatment for ovarian cancer and is cancer free.  They are looking to start getting more active. Chlorthalidone was added 10/2019 and benazepril 12/2019.  He did not note a significant change in his blood pressure.  It is running mostly in the 160-170/90s.  He was given a blood pressure cuff but it is not measuring his upper arm accurately.  They mostly cook at home and he tries to limit his sodium intake.  He takes his medications daily.  Today,   He denies any palpitations, chest pain, or shortness of breath, lightheadedness, headaches, syncope, orthopnea, PND, lower extremity edema or exertional symptoms.  Previous antihypertensives: Amlodipine   Past Medical History:  Diagnosis Date   Morbid obesity (HCC) 09/14/2019   Snoring 01/09/2020     Past Surgical History:  Procedure Laterality Date   CHOLECYSTECTOMY      Current Medications: No outpatient medications have been marked as taking for the 09/04/21 encounter (Appointment) with Chilton Si, MD.     Allergies:   Patient has no known allergies.   Social History   Socioeconomic History   Marital status: Single    Spouse name: Not on file   Number of children: Not on file   Years of education: Not on file   Highest education level: Not on file  Occupational History   Not on file  Tobacco Use   Smoking status: Never   Smokeless tobacco: Never  Substance and Sexual Activity   Alcohol use: Yes   Drug use: No   Sexual activity: Not on file  Other Topics Concern   Not on file  Social History Narrative   Not on file   Social Determinants of Health   Financial Resource Strain: Not on file  Food Insecurity: Not on file  Transportation Needs: Not on file  Physical Activity: Not on file  Stress: Not on file  Social Connections: Not on file     Family History: The patient's family history includes Appendicitis in his mother; HIV in his father.  ROS:   Please see the history of present illness.     All other systems reviewed and are negative.  EKGs/Labs/Other Studies Reviewed:  EKG:   09/04/21: Sinus ***, rate *** bpm 01/09/20: sinus rhythm.  Rate 92 bpm.  LAD.  Incomplete LBBB.  LVH with repolarization abnormalities.   Recent Labs: 11/23/2020: ALT 18; BUN 12; Creatinine, Ser 1.20; Potassium 4.0; Sodium 141   Recent Lipid Panel    Component Value Date/Time   CHOL 192 11/23/2020 1530   TRIG 128 11/23/2020 1530   HDL 43 11/23/2020 1530   CHOLHDL 4.5 11/23/2020 1530   LDLCALC 126 (H) 11/23/2020 1530    Physical Exam:     Wt Readings from Last 3 Encounters:  04/11/21 (!) 379 lb 3.2 oz (172 kg)  12/28/20 (!) 376 lb 9.6 oz (170.8 kg)  12/20/20 (!) 370 lb (167.8 kg)  VS:  There were no vitals taken for this visit. , BMI There is no height or  weight on file to calculate BMI. GENERAL:  Well appearing HEENT: Pupils equal round and reactive, fundi not visualized, oral mucosa unremarkable NECK:  No jugular venous distention, waveform within normal limits, carotid upstroke brisk and symmetric, no bruits LUNGS:  Clear to auscultation bilaterally HEART:  RRR.  PMI not displaced or sustained,S1 and S2 within normal limits, no S3, no S4, no clicks, no rubs, no murmurs ABD:  Flat, positive bowel sounds normal in frequency in pitch, no bruits, no rebound, no guarding, no midline pulsatile mass, no hepatomegaly, no splenomegaly EXT:  2 plus pulses throughout, no edema, no cyanosis no clubbing SKIN:  No rashes no nodules NEURO:  Cranial nerves II through XII grossly intact, motor grossly intact throughout PSYCH:  Cognitively intact, oriented to person place and time  ASSESSMENT:    No diagnosis found.   PLAN:   No problem-specific Assessment & Plan notes found for this encounter.  # Hypertension:  BP remains poorly controlled on multiple agents.  He is going to bring his home blood pressure cuff to the office and check it on his forearm to see if that is accurate.  We discussed looking for secondary causes including checking serum renin and aldosterone.  We also discussed sleep study and renal Dopplers.  However given that he does not have insurance right now he is not interested in doing these things.  We will start spironolactone 25 mg daily and check a BMP in 1 week.  He is very excited to start back exercising and this was encouraged.  He plans to start doing the keto diet with his wife.  # Possible OSA: He has snoring and apneic episodes.  Will need a sleep study when able.  # Morbid Obesity:  Exercise as above.    Disposition: FU with Tiffany C. Duke Salvia, MD, [facility] in ***  Medication Adjustments/Labs and Tests Ordered: Current medicines are reviewed at length with the patient today.  Concerns regarding medicines are outlined  above.  No orders of the defined types were placed in this encounter.  No orders of the defined types were placed in this encounter.  I,Mykaella Javier,acting as a scribe for Chilton Si, MD.,have documented all relevant documentation on the behalf of Chilton Si, MD,as directed by  Chilton Si, MD while in the presence of Chilton Si, MD.  ***  Signed, Pieter Partridge  09/04/2021 8:48 AM    [facility]

## 2021-09-04 ENCOUNTER — Encounter (HOSPITAL_BASED_OUTPATIENT_CLINIC_OR_DEPARTMENT_OTHER): Payer: Self-pay

## 2021-09-04 ENCOUNTER — Other Ambulatory Visit (HOSPITAL_BASED_OUTPATIENT_CLINIC_OR_DEPARTMENT_OTHER): Payer: Self-pay | Admitting: *Deleted

## 2021-09-04 ENCOUNTER — Ambulatory Visit (HOSPITAL_BASED_OUTPATIENT_CLINIC_OR_DEPARTMENT_OTHER): Payer: 59 | Admitting: Cardiovascular Disease

## 2021-09-04 MED ORDER — CHLORTHALIDONE 25 MG PO TABS: 25.0000 mg | ORAL_TABLET | Freq: Every day | ORAL | 0 refills | Status: DC

## 2021-09-04 MED ORDER — AMLODIPINE BESY-BENAZEPRIL HCL 10-40 MG PO CAPS: 1.0000 | ORAL_CAPSULE | Freq: Every day | ORAL | 0 refills | Status: DC

## 2021-09-04 NOTE — Telephone Encounter (Signed)
Rx(s) sent to pharmacy electronically.  

## 2021-09-09 ENCOUNTER — Ambulatory Visit (INDEPENDENT_AMBULATORY_CARE_PROVIDER_SITE_OTHER): Payer: 59 | Admitting: Cardiovascular Disease

## 2021-09-09 ENCOUNTER — Encounter (HOSPITAL_BASED_OUTPATIENT_CLINIC_OR_DEPARTMENT_OTHER): Payer: Self-pay | Admitting: Cardiovascular Disease

## 2021-09-09 ENCOUNTER — Other Ambulatory Visit: Payer: Self-pay

## 2021-09-09 VITALS — BP 136/86 | HR 63 | Ht >= 80 in | Wt 378.0 lb

## 2021-09-09 DIAGNOSIS — E7800 Pure hypercholesterolemia, unspecified: Secondary | ICD-10-CM

## 2021-09-09 DIAGNOSIS — I10 Essential (primary) hypertension: Secondary | ICD-10-CM | POA: Diagnosis not present

## 2021-09-09 DIAGNOSIS — G4733 Obstructive sleep apnea (adult) (pediatric): Secondary | ICD-10-CM

## 2021-09-09 HISTORY — DX: Obstructive sleep apnea (adult) (pediatric): G47.33

## 2021-09-09 MED ORDER — SPIRONOLACTONE 25 MG PO TABS: 25.0000 mg | ORAL_TABLET | Freq: Every day | ORAL | 3 refills | Status: DC

## 2021-09-09 NOTE — Assessment & Plan Note (Signed)
Continue CPAP.  

## 2021-09-09 NOTE — Assessment & Plan Note (Signed)
Blood pressure remains above goal.  He was previously on spironolactone and his blood pressure is better controlled.  Resume spironolactone 25 mg daily.  Continue amlodipine, benazepril, and chlorthalidone.  Check lipids and a CMP in a week.  He will track his blood pressures at home and understands that he can use his cuff on his lower arm.  He will bring his blood pressure log to follow-up.

## 2021-09-09 NOTE — Progress Notes (Signed)
Hypertension Clinic Initial Assessment:    Date:  09/09/2021   ID:  Christopher Bruce, DOB [birth year], MRN [id number]  PCP:  Cain Saupe, MD (Inactive)  Cardiologist:  None  Nephrologist:  Referring MD: No ref. provider found   CC: Hypertension  History of Present Illness:    Christopher Bruce is a 46 y.o. male with a hx of hypertension and obestiy here for follow up.  He established care in the advanced hypertension clinic 09/2019.  He reports that he was first diagnosed with hypertension at around 36.  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  He followed up with the doctor regularly and his BP was well-controlled on amlodipine 10 mg.  When he moved to [place] he did not have insurance and he was not on his medicine for a while.  His blood pressure was elevated over 200 systolic.  At his first appointment amlodipine was restarted.  He was referred for sleep study but has not been able to be patient due to lack of insurance. Chlorthalidone was added 10/2019 and benazepril 12/2019 without a significant improvement in his blood pressure.  At his initial visit we discussed the further work-up for secondary causes, but given his lack of insurance we deferred renal Dopplers and additional testing.  We did start him on spironolactone 25 mg daily and discussed the importance of increasing exercise and limiting sodium.  He was not seen again until he followed up with Joni Reining on 11/2020.  He had been off his medication but recently got a new job with insurance and his medications were reordered.  He also ordered an echo 12/2020 that revealed LVEF 60 to 65% with severe LVH and grade 1 diastolic dysfunction.  At his follow-up visit 12/2020 his blood pressure was 130/80 and he was feeling well.  He had also lost 12 pounds and was really focused on diet and exercise.  He was referred for a sleep study and started on CPAP.  When he saw Dr. Tresa Endo 04/2021 he noted that he was  feeling much better since starting treatment.  His wife is in the hospital fighting cancer.  He has been off of his usual routine.  He was not on his medications for several weeks and 1 day while he was at the hospital he stood up and felt lightheaded.  When he checked his blood pressure was 177/83.  Since then he restarted his medicine.  He is unable to check his blood pressures at home because his cuff is too small for his upper arm.  He has been feeling better.  He also started back on his CPAP around that time and feels better.  He has not been getting much exercise.  He struggles with food choices because his wife is the main cook and she is in the hospital.  He also got out of the habit of exercising when the weather got hot last summer.  He denies any exertional chest pain or shortness of breath.  He also denies lower extremity edema, orthopnea, or PND.  Previous antihypertensives: Amlodipine   Past Medical History:  Diagnosis Date   Morbid obesity (HCC) 09/14/2019   OSA (obstructive sleep apnea) 09/09/2021   Snoring 01/09/2020    Past Surgical History:  Procedure Laterality Date   CHOLECYSTECTOMY      Current Medications: Current Meds  Medication Sig   amLODipine-benazepril (LOTREL) 10-40 MG capsule Take 1 capsule by mouth daily.   chlorthalidone (HYGROTON)  25 MG tablet Take 1 tablet (25 mg total) by mouth daily.   Multiple Vitamins-Minerals (MULTIVITAMIN WITH MINERALS) tablet Take 1 tablet by mouth daily.     Allergies:   Patient has no known allergies.   Social History   Socioeconomic History   Marital status: Single    Spouse name: Not on file   Number of children: Not on file   Years of education: Not on file   Highest education level: Not on file  Occupational History   Not on file  Tobacco Use   Smoking status: Never   Smokeless tobacco: Never  Substance and Sexual Activity   Alcohol use: Yes   Drug use: No   Sexual activity: Not on file  Other Topics Concern    Not on file  Social History Narrative   Not on file   Social Determinants of Health   Financial Resource Strain: Not on file  Food Insecurity: Not on file  Transportation Needs: Not on file  Physical Activity: Not on file  Stress: Not on file  Social Connections: Not on file     Family History: The patient's family history includes Appendicitis in his mother; HIV in his father.  ROS:   Please see the history of present illness.     All other systems reviewed and are negative.  EKGs/Labs/Other Studies Reviewed:    EKG:  EKG is ordered today.  The ekg ordered today demonstrates sinus rhythm.  Rate 92 bpm.  LAD.  Incomplete LBBB.  LVH with repolarization abnormalities.   Echo 12/2020: 1. Left ventricular ejection fraction, by estimation, is 60 to 65%. Left  ventricular ejection fraction by PLAX is 61 %. The left ventricle has  normal function. The left ventricle has no regional wall motion  abnormalities. There is severe concentric left   ventricular hypertrophy. Left ventricular diastolic parameters are  consistent with Grade I diastolic dysfunction (impaired relaxation).   2. Right ventricular systolic function is normal. The right ventricular  size is normal.   3. The mitral valve is normal in structure. Trivial mitral valve  regurgitation. No evidence of mitral stenosis.   4. The aortic valve is tricuspid. Aortic valve regurgitation is trivial.  No aortic stenosis is present.   5. Aortic dilatation noted. There is mild dilatation of the ascending  aorta, measuring 37 mm.   6. The inferior vena cava is normal in size with greater than 50%  respiratory variability, suggesting right atrial pressure of 3 mmHg.   Recent Labs: 11/23/2020: ALT 18; BUN 12; Creatinine, Ser 1.20; Potassium 4.0; Sodium 141   Recent Lipid Panel    Component Value Date/Time   CHOL 192 11/23/2020 1530   TRIG 128 11/23/2020 1530   HDL 43 11/23/2020 1530   CHOLHDL 4.5 11/23/2020 1530   LDLCALC  126 (H) 11/23/2020 1530    Physical Exam:     VS:  BP 136/86 (BP Location: Right Arm, Patient Position: Sitting, Cuff Size: Large)    Pulse 63    Ht 6\' 9"  (2.057 m)    Wt (!) 378 lb (171.5 kg)    BMI 40.51 kg/m  , BMI Body mass index is 40.51 kg/m. GENERAL:  Well appearing HEENT: Pupils equal round and reactive, fundi not visualized, oral mucosa unremarkable NECK:  No jugular venous distention, waveform within normal limits, carotid upstroke brisk and symmetric, no bruits LUNGS:  Clear to auscultation bilaterally HEART:  RRR.  PMI not displaced or sustained,S1 and S2 within normal limits, no  S3, no S4, no clicks, no rubs, no murmurs ABD:  Flat, positive bowel sounds normal in frequency in pitch, no bruits, no rebound, no guarding, no midline pulsatile mass, no hepatomegaly, no splenomegaly EXT:  2 plus pulses throughout, no edema, no cyanosis no clubbing SKIN:  No rashes no nodules NEURO:  Cranial nerves II through XII grossly intact, motor grossly intact throughout PSYCH:  Cognitively intact, oriented to person place and time   ASSESSMENT:    1. Essential hypertension   2. Hypercholesterolemia   3. Morbid obesity (HCC)   4. OSA (obstructive sleep apnea)      PLAN:    Essential hypertension Blood pressure remains above goal.  He was previously on spironolactone and his blood pressure is better controlled.  Resume spironolactone 25 mg daily.  Continue amlodipine, benazepril, and chlorthalidone.  Check lipids and a CMP in a week.  He will track his blood pressures at home and understands that he can use his cuff on his lower arm.  He will bring his blood pressure log to follow-up.  Morbid obesity (HCC) He felt much better when he was exercising regularly.  He is going to start back going for a 30-minute walk after work.  He will do his best to make healthier food choices and meal prep while his wife is in the hospital.  OSA (obstructive sleep apnea) Continue  CPAP.   Disposition:    FU with MD in 4 months.  PharmD/APP  in 1 month.   Medication Adjustments/Labs and Tests Ordered: Current medicines are reviewed at length with the patient today.  Concerns regarding medicines are outlined above.  No orders of the defined types were placed in this encounter.  No orders of the defined types were placed in this encounter.    Signed, Chilton Si, MD  09/09/2021 3:07 PM    [facility]

## 2021-09-09 NOTE — Patient Instructions (Addendum)
Medication Instructions:  START SPIROLACTONE 25 MG DAILY   *If you need a refill on your cardiac medications before your next appointment, please call your pharmacy*  Lab Work: FASTING LP/CMET IN 1 WEEK   If you have labs (blood work) drawn today and your tests are completely normal, you will receive your results only by: MyChart Message (if you have MyChart) OR A paper copy in the mail If you have any lab test that is abnormal or we need to change your treatment, we will call you to review the results.  Testing/Procedures: NONE  Follow-Up: At [facility], you and your health needs are our priority.  As part of our continuing mission to provide you with exceptional heart care, we have created designated Provider Care Teams.  These Care Teams include your primary Cardiologist (physician) and Advanced Practice Providers (APPs -  Physician Assistants and Nurse Practitioners) who all work together to provide you with the care you need, when you need it.  We recommend signing up for the patient portal called "MyChart".  Sign up information is provided on this After Visit Summary.  MyChart is used to connect with patients for Virtual Visits (Telemedicine).  Patients are able to view lab/test results, encounter notes, upcoming appointments, etc.  Non-urgent messages can be sent to your provider as well.   To learn more about what you can do with MyChart, go to [email or web address].    Your next appointment:   10/21/2021 AT 3:35 PM WITH Edd Fabian, NP    Other Instructions  Exercise recommendations: The American Heart Association recommends 150 minutes of moderate intensity exercise weekly. Try 30 minutes of moderate intensity exercise 4-5 times per week. This could include walking, jogging, or swimming.

## 2021-09-09 NOTE — Assessment & Plan Note (Signed)
He felt much better when he was exercising regularly.  He is going to start back going for a 30-minute walk after work.  He will do his best to make healthier food choices and meal prep while his wife is in the hospital.

## 2021-10-15 NOTE — Progress Notes (Deleted)
error 

## 2021-10-20 NOTE — Progress Notes (Deleted)
? ? ?Office Visit  ?  ?Patient Name: Christopher Bruce ?Date of Encounter: 10/20/2021 ? ?Primary Care Provider:  Cain Saupe, MD ?Primary Cardiologist:  None ?Primary Electrophysiologist: None ?Chief Complaint  ?  ?Christopher Bruce is a 46 y.o. male presents today for 1 month follow-up ? ?History of Present Illness  ?  ?Christopher Bruce is a 46 y.o. male with PMH of HTN, obesity, HLD, OSA.  Echo completed 5/22 with EF 60-65%, ventricular hypertrophy, grade 1 DD, normal valve function, mild aortic dilation at 37 mm.  Sleep study completed 9/22 with Dr. Tresa Endo and started back on CPAP. 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  He was last seen on 2/23 by Dr. Duke Salvia and was started on spironolactone 25 mg, with directions to track blood pressures daily.  He was also encouraged to increase activity and exercise.  He is encouraged to make better meal choices while his wife is in the hospital. ? ?Since{Blank single:19197::"last being seen in our clinic","discharge from hospital"} the patient reports doing ***.  @He /She@ denies chest pain, palpitations, dyspnea, PND, orthopnea, nausea, vomiting, dizziness, syncope, edema, weight gain, or early satiety. ? ? ? ?Notes: ?Lipids, BMET for spironolactone ?Medication compliance and regular exercise since last appointment ?Check lipids today ?CPAP usage and life stressors ?32-month follow-up with Dr. 8-month ?Past Medical History  ?  ?Past Medical History:  ?Diagnosis Date  ? Morbid obesity (HCC) 09/14/2019  ? OSA (obstructive sleep apnea) 09/09/2021  ? Snoring 01/09/2020  ? ?Past Surgical History:  ?Procedure Laterality Date  ? CHOLECYSTECTOMY    ? ? ?Allergies ? ?No Known Allergies ? ?Home Medications  ?  ?Current Outpatient Medications  ?Medication Sig Dispense Refill  ? amLODipine-benazepril (LOTREL) 10-40 MG capsule Take 1 capsule by mouth daily. 90 capsule 0  ? chlorthalidone (HYGROTON) 25 MG tablet Take 1 tablet (25 mg total) by  mouth daily. 90 tablet 0  ? Multiple Vitamins-Minerals (MULTIVITAMIN WITH MINERALS) tablet Take 1 tablet by mouth daily.    ? spironolactone (ALDACTONE) 25 MG tablet Take 1 tablet (25 mg total) by mouth daily. 90 tablet 3  ? ?No current facility-administered medications for this visit.  ?  ? ?Review of Systems  ?Please see the history of present illness.    ?(+)*** ?(+)*** ? ?All other systems reviewed and are otherwise negative except as noted above. ? ?Physical Exam  ?  ?Wt Readings from Last 3 Encounters:  ?09/09/21 (!) 378 lb (171.5 kg)  ?04/11/21 (!) 379 lb 3.2 oz (172 kg)  ?12/28/20 (!) 376 lb 9.6 oz (170.8 kg)  ? ?12/30/20 were no vitals filed for this visit.,There is no height or weight on file to calculate BMI. ? ?GEN: Well nourished, well developed, in no acute distress. ?Neck: Supple, no JVD, carotid bruits, or masses. ?Cardiac: S1,S2,***RRR, no murmurs, rubs, or gallops. No clubbing, cyanosis, edema.  ?Radials/PT 2+ and equal bilaterally.  ?Respiratory:  ***Respirations regular and unlabored, clear to auscultation bilaterally. ?MS: no deformity or atrophy. ?Skin: warm and dry, no rash. ?Neuro:  Strength and sensation are intact. ?Psych: Normal affect. ? ?EKG/LABS/Other Studies Reviewed  ?  ?ECG personally reviewed by me today - ***  with rate of ***- no acute changes. ? ?Risk Assessment/Calculations:   ?{Does this patient have ATRIAL FIBRILLATION?:[id number]} ? ?    ? ? ?Lab Results  ?Component Value Date  ? WBC 13.0 (H) 02/21/2014  ? HGB 16.5 02/21/2014  ? HCT 48.1 02/21/2014  ? MCV  85.6 02/21/2014  ? PLT 199 02/21/2014  ? ?Lab Results  ?Component Value Date  ? CREATININE 1.20 11/23/2020  ? BUN 12 11/23/2020  ? NA 141 11/23/2020  ? K 4.0 11/23/2020  ? CL 102 11/23/2020  ? CO2 23 11/23/2020  ? ?Lab Results  ?Component Value Date  ? ALT 18 11/23/2020  ? AST 18 11/23/2020  ? ALKPHOS 89 11/23/2020  ? BILITOT 0.4 11/23/2020  ? ?Lab Results  ?Component Value Date  ? CHOL 192 11/23/2020  ? HDL 43 11/23/2020  ?  LDLCALC 126 (H) 11/23/2020  ? TRIG 128 11/23/2020  ? CHOLHDL 4.5 11/23/2020  ?  ?Lab Results  ?Component Value Date  ? HGBA1C 5.0 09/14/2019  ? ? ?Assessment & Plan  ?  ?1. Essential hypertension ?-Last appointment patient was started on spironolactone 25 mg.  Currently on benazepril 10-40 mg, chlorthalidone 25 mg ?-BP well ***. Continue current antihypertensive regimen.   ? ? ?2. Morbid obesity: ?-Weight loss via diet and exercise encouraged. Discussed the impact being overweight would have on cardiovascular risk.  ?-Exercise a minimum of 150 minutes minutes moderate cardiovascular per week ? ? ?3.  Hypercholesteremia: ?-Last LDL was 126 on 4/22 triglycerides 128, total cholesterol 192, HDL 43 ?-Check lipids today ?-May require statin initiation if LDL above goal with OSA. ? ?4.OSA: ?-CPAP compliance encouraged.  ?-Follow-up with Dr. Tresa Endo for sleep apnea ? ?   ?Disposition: Follow-up with None or APP in *** months ?{Are you ordering a CV Procedure (e.g. stress test, cath, DCCV, TEE, etc)?   Press F2        :[id number]}  ? ?Medication Adjustments/Labs and Tests Ordered: ?Current medicines are reviewed at length with the patient today.  Concerns regarding medicines are outlined above.  ?Tests Ordered: ?No orders of the defined types were placed in this encounter. ? ?Medication Changes: ?No orders of the defined types were placed in this encounter. ? ? ?Signed, ?Napoleon Form, Leodis Rains, NP ?10/20/2021, 4:18 PM ?[facility] ? ?Notice: This dictation was prepared with Dragon dictation along with smaller phrase technology. Any transcriptional errors that result from this process are unintentional and may not be corrected upon review. ?  ?I spent***minutes examining this patient, reviewing medications, and using patient centered shared decision making involving her cardiac care.  Prior to her visit I spent greater than 20 minutes reviewing her past medical history,  medications, and prior cardiac  tests. ?

## 2021-10-21 ENCOUNTER — Ambulatory Visit (HOSPITAL_BASED_OUTPATIENT_CLINIC_OR_DEPARTMENT_OTHER): Payer: 59 | Admitting: General Practice

## 2021-11-17 ENCOUNTER — Encounter (HOSPITAL_BASED_OUTPATIENT_CLINIC_OR_DEPARTMENT_OTHER): Payer: Self-pay | Admitting: Emergency Medicine

## 2021-11-17 ENCOUNTER — Other Ambulatory Visit: Payer: Self-pay

## 2021-11-17 ENCOUNTER — Emergency Department (HOSPITAL_BASED_OUTPATIENT_CLINIC_OR_DEPARTMENT_OTHER): Payer: 59 | Admitting: Radiology

## 2021-11-17 ENCOUNTER — Emergency Department (HOSPITAL_BASED_OUTPATIENT_CLINIC_OR_DEPARTMENT_OTHER)
Admission: EM | Admit: 2021-11-17 | Discharge: 2021-11-17 | Disposition: A | Discharge status: Home / Self Care | Payer: 59 | Attending: Emergency Medicine | Admitting: Emergency Medicine

## 2021-11-17 DIAGNOSIS — M7989 Other specified soft tissue disorders: Secondary | ICD-10-CM | POA: Diagnosis present

## 2021-11-17 DIAGNOSIS — L03011 Cellulitis of right finger: Secondary | ICD-10-CM | POA: Diagnosis not present

## 2021-11-17 DIAGNOSIS — Z79899 Other long term (current) drug therapy: Secondary | ICD-10-CM | POA: Diagnosis not present

## 2021-11-17 DIAGNOSIS — I10 Essential (primary) hypertension: Secondary | ICD-10-CM | POA: Insufficient documentation

## 2021-11-17 MED ORDER — DOXYCYCLINE HYCLATE 100 MG PO CAPS: 100.0000 mg | ORAL_CAPSULE | Freq: Two times a day (BID) | ORAL | 0 refills | Status: DC

## 2021-11-17 MED ORDER — LIDOCAINE HCL (PF) 1 % IJ SOLN
10.0000 mL | Freq: Once | INTRAMUSCULAR | Status: AC
  Administered 2021-11-17: 10 mL
  Filled 2021-11-17: qty 10

## 2021-11-17 MED ORDER — DOXYCYCLINE HYCLATE 100 MG PO TABS
100.0000 mg | ORAL_TABLET | Freq: Once | ORAL | Status: AC
  Administered 2021-11-17: 100 mg via ORAL
  Filled 2021-11-17: qty 1

## 2021-11-17 MED ORDER — DOXYCYCLINE HYCLATE 100 MG PO CAPS: 100.0000 mg | ORAL_CAPSULE | Freq: Two times a day (BID) | ORAL | 0 refills | Status: AC

## 2021-11-17 NOTE — ED Notes (Signed)
Suture cart and Lidocaine left outside of room. ?

## 2021-11-17 NOTE — ED Notes (Signed)
Discharge paperwork given and understood. 

## 2021-11-17 NOTE — Discharge Instructions (Addendum)
You came to the emergency department today to be evaluated for your finger swelling.  You are found to have a paronychia that was treated with incision and drainage.  Please apply warm compresses 3-4 times a day as we discussed.  Please take the antibiotic as prescribed.  Please follow-up with your primary care doctor next week for wound recheck. ? ?Contact a health care provider if: ?Your symptoms get worse or do not improve with treatment. ?You have continued or increased fluid, blood, or pus coming from the affected area. ?Your affected finger, toe, or joint becomes swollen or difficult to move. ?You have a fever or chills. ?There is redness spreading away from the affected area. ?

## 2021-11-17 NOTE — ED Provider Notes (Signed)
?[facility] EM[facility]PT ?Provider Note ? ? ?CSN: [id number] ?Arrival date & time: 11/17/21  1146 ? ?  ? ?History ? ?Chief Complaint  ?Patient presents with  ? Finger Injury  ? ? ?Christopher Bruce is a 46 y.o. male with history of morbid obesity, obstructive sleep apnea, hypertension.  Presents emergency department with a chief plaint of swelling to right third finger.  Patient states that he bites his fingers frequently.  On Tuesday he noted swelling to his right third finger on the dorsal aspect below the nailbed.  Patient states that swelling has gotten progressively worse over time.  Patient does endorse pain to affected area.  Decreased range of motion to DIP secondary to swelling.  Patient is right-hand dominant. ? ?Patient denies any fever, chills, numbness, weakness.   ? ?HPI ? ?  ? ?Home Medications ?Prior to Admission medications   ?Medication Sig Start Date End Date Taking? Authorizing Provider  ?amLODipine-benazepril (LOTREL) 10-40 MG capsule Take 1 capsule by mouth daily. 09/04/21   Chilton Si, MD  ?chlorthalidone (HYGROTON) 25 MG tablet Take 1 tablet (25 mg total) by mouth daily. 09/04/21   Chilton Si, MD  ?Multiple Vitamins-Minerals (MULTIVITAMIN WITH MINERALS) tablet Take 1 tablet by mouth daily.    [provider]  ?spironolactone (ALDACTONE) 25 MG tablet Take 1 tablet (25 mg total) by mouth daily. 09/09/21   Chilton Si, MD  ?   ? ?Allergies    ?Patient has no known allergies.   ? ?Review of Systems   ?Review of Systems  ?Constitutional:  Negative for chills and fever.  ?Musculoskeletal:  Positive for arthralgias and joint swelling.  ?Skin:  Negative for color change, pallor, rash and wound.  ?Neurological:  Negative for weakness and numbness.  ? ?Physical Exam ?Updated Vital Signs ?BP 138/83 (BP Location: Left Arm)   Pulse 60   Temp 98.1 ?F (36.7 ?C) (Oral)   Resp 16   Ht 6\' 9"  (2.057 m)   Wt (!) 167.8 kg   SpO2 100%   BMI 39.65 kg/m?  ?Physical  Exam ?Vitals and nursing note reviewed.  ?Constitutional:   ?   General: He is not in acute distress. ?   Appearance: He is not ill-appearing, toxic-appearing or diaphoretic.  ?HENT:  ?   Head: Normocephalic.  ?Eyes:  ?   General: No scleral icterus.    ?   Right eye: No discharge.     ?   Left eye: No discharge.  ?Cardiovascular:  ?   Rate and Rhythm: Normal rate.  ?   Pulses:     ?     Radial pulses are 2+ on the right side and 2+ on the left side.  ?Pulmonary:  ?   Effort: Pulmonary effort is normal.  ?Musculoskeletal:  ?   Right wrist: Normal.  ?   Right hand: Swelling and tenderness present. No deformity or lacerations. Decreased range of motion. Normal sensation. Normal capillary refill.  ?   Comments: Swelling to dorsal aspect of right third finger below nail blood with noted fluctuance.  Decreased range of motion at right third DIP joint secondary to swelling.  No fluctuance to right third finger pad to suggest felon  ?Skin: ?   General: Skin is warm and dry.  ?Neurological:  ?   General: No focal deficit present.  ?   Mental Status: He is alert.  ?Psychiatric:     ?   Behavior: Behavior is cooperative.  ? ? ?ED Results / Procedures /  Treatments   ?Labs ?(all labs ordered are listed, but only abnormal results are displayed) ?Labs Reviewed - No data to display ? ?EKG ?None ? ?Radiology ?DG Hand Complete Right ? ?Result Date: 11/17/2021 ?CLINICAL DATA:  Third digit swelling.  No injury. EXAM: RIGHT HAND - COMPLETE 3+ VIEW COMPARISON:  None. FINDINGS: Soft tissue swelling is noted of the distal third finger, most prominent just proximal to the base of the nail. This suggests a paronychia. No fracture. No bone lesion. No bone resorption to suggest osteomyelitis. Joints are normally spaced and aligned. IMPRESSION: 1. No fracture or bone lesion.  No evidence of osteomyelitis. 2. Distal right third finger soft tissue swelling, location suspicious for a paronychia. Electronically Signed   By: Amie Portlandavid  Ormond M.D.   On:  11/17/2021 14:25   ? ?Procedures ?Marland Kitchen..Incision and Drainage ? ?Date/Time: 11/18/2021 12:44 AM ?Performed by: Haskel SchroederBadalamente, Deserai Cansler R, PA-C ?Authorized by: Haskel SchroederBadalamente, Tanny Harnack R, PA-C  ? ?Consent:  ?  Consent obtained:  Verbal ?  Consent given by:  Patient ?  Risks, benefits, and alternatives were discussed: yes   ?  Risks discussed:  Bleeding, damage to other organs, incomplete drainage, infection and pain ?  Alternatives discussed:  No treatment, delayed treatment and alternative treatment ?Universal protocol:  ?  Procedure explained and questions answered to patient or proxy's satisfaction: yes   ?  Imaging studies available: yes   ?  Required blood products, implants, devices, and special equipment available: yes   ?  Patient identity confirmed:  Verbally with patient and arm band ?Location:  ?  Indications for incision and drainage: Paronychia. ?  Location:  Upper extremity ?  Upper extremity location:  Finger ?Pre-procedure details:  ?  Skin preparation:  Povidone-iodine ?Anesthesia:  ?  Anesthesia method:  Nerve block ?  Block needle gauge:  25 G ?  Block anesthetic:  Lidocaine 1% w/o epi ?  Block injection procedure:  Anatomic landmarks identified, introduced needle, incremental injection, negative aspiration for blood and anatomic landmarks palpated ?  Block outcome:  Anesthesia achieved ?Procedure type:  ?  Complexity:  Simple ?Procedure details:  ?  Incision types:  Stab incision ?  Drainage:  Purulent ?  Drainage amount:  Moderate ?  Wound treatment:  Wound left open ?Post-procedure details:  ?  Procedure completion:  Tolerated well, no immediate complications  ? ? ?Medications Ordered in ED ?Medications - No data to display ? ?ED Course/ Medical Decision Making/ A&P ?  ?                        ?Medical Decision Making ?Amount and/or Complexity of Data Reviewed ?Radiology: ordered. ? ?Risk ?Prescription drug management. ? ? ?Alert 46 year old male in no acute distress, nontoxic appearing.  Presents to the  emergency department with a chief complaint of swelling to right third digit. ? ?Information obtained from patient.  Past medical records were reviewed including previous provider notes, labs, and imaging.  Patient has past medical history as outlined in HPI which complicates his care. ? ?Based on physical exam patient has clinical paronychia.  X-ray imaging was obtained to evaluate for any acute osseous involvement. ? ?I personally viewed interpretations x-ray imaging.  Agree with radiology interpretation of no evidence of osteomyelitis.  Distal right third finger soft tissue swelling, location suspicious for paronychia. ? ?Incision and drainage of paronychia performed as noted above.  Procedure tolerated well.  We will start patient on course of doxycycline.  Patient  to follow-up with PCP for wound recheck. ? ?Based on patient's chief complaint, I considered admission might be necessary, however after reassuring ED workup feel patient is reasonable for discharge.  Discussed results, findings, treatment and follow up. Patient advised of return precautions. Patient verbalized understanding and agreed with plan. ? ?Portions of this note were generated with Scientist, clinical (histocompatibility and immunogenetics). Dictation errors may occur despite best attempts at proofreading. ? ? ? ? ? ? ? ? ?Final Clinical Impression(s) / ED Diagnoses ?Final diagnoses:  ?None  ? ? ?Rx / DC Orders ?ED Discharge Orders   ? ? None  ? ?  ? ? ?  ?Haskel Schroeder, PA-C ?11/18/21 1610 ? ?  ?Tegeler, Canary Brim, MD ?11/18/21 1503 ? ?

## 2021-11-17 NOTE — ED Triage Notes (Signed)
Pt presents with swelling to right middle finger x 4 days ?

## 2022-04-15 ENCOUNTER — Other Ambulatory Visit (HOSPITAL_BASED_OUTPATIENT_CLINIC_OR_DEPARTMENT_OTHER): Payer: Self-pay | Admitting: *Deleted

## 2022-04-15 MED ORDER — CHLORTHALIDONE 25 MG PO TABS: 25.0000 mg | ORAL_TABLET | Freq: Every day | ORAL | 2 refills | Status: DC

## 2022-04-15 MED ORDER — AMLODIPINE BESY-BENAZEPRIL HCL 10-40 MG PO CAPS
1.0000 | ORAL_CAPSULE | Freq: Every day | ORAL | 2 refills | Status: DC
Start: 2022-04-15 — End: 2022-07-14

## 2022-04-15 MED ORDER — SPIRONOLACTONE 25 MG PO TABS: 25.0000 mg | ORAL_TABLET | Freq: Every day | ORAL | 2 refills | Status: DC

## 2022-04-15 NOTE — Telephone Encounter (Signed)
Rx(s) sent to pharmacy electronically.  

## 2022-07-14 ENCOUNTER — Ambulatory Visit (HOSPITAL_BASED_OUTPATIENT_CLINIC_OR_DEPARTMENT_OTHER): Payer: Managed Care, Other (non HMO) | Admitting: Cardiovascular Disease

## 2022-07-14 ENCOUNTER — Encounter (HOSPITAL_BASED_OUTPATIENT_CLINIC_OR_DEPARTMENT_OTHER): Payer: Self-pay | Admitting: Cardiovascular Disease

## 2022-07-14 VITALS — BP 180/84 | HR 69 | Ht >= 80 in | Wt 360.3 lb

## 2022-07-14 DIAGNOSIS — I10 Essential (primary) hypertension: Secondary | ICD-10-CM | POA: Diagnosis not present

## 2022-07-14 DIAGNOSIS — E669 Obesity, unspecified: Secondary | ICD-10-CM | POA: Diagnosis not present

## 2022-07-14 DIAGNOSIS — Z5181 Encounter for therapeutic drug level monitoring: Secondary | ICD-10-CM

## 2022-07-14 MED ORDER — AMLODIPINE BESY-BENAZEPRIL HCL 10-40 MG PO CAPS: 1.0000 | ORAL_CAPSULE | Freq: Every day | ORAL | 2 refills | Status: DC

## 2022-07-14 MED ORDER — CHLORTHALIDONE 25 MG PO TABS: 25.0000 mg | ORAL_TABLET | Freq: Every day | ORAL | 2 refills | Status: DC

## 2022-07-14 MED ORDER — SPIRONOLACTONE 25 MG PO TABS: 25.0000 mg | ORAL_TABLET | Freq: Every day | ORAL | 2 refills | Status: DC

## 2022-07-14 NOTE — Progress Notes (Signed)
Hypertension Clinic Initial Assessment:    Date:  08/10/2022   ID:  Christopher Bruce, DOB [birth year], MRN [id number]  PCP:  Chilton Si, MD  Cardiologist:  None  Nephrologist:  Referring MD: Chilton Si, MD   CC: Hypertension  History of Present Illness:    Christopher Bruce is a 46 y.o. male with a hx of hypertension and obestiy here for follow up.  He established care in the advanced hypertension clinic 09/2019.  He reports that he was first diagnosed with hypertension at around 36.  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  He followed up with the doctor regularly and his BP was well-controlled on amlodipine 10 mg.  When he moved to [place] he did not have insurance and he was not on his medicine for a while.  His blood pressure was elevated over 200 systolic.  At his first appointment amlodipine was restarted.  He was referred for sleep study but has not been able to be patient due to lack of insurance. Chlorthalidone was added 10/2019 and benazepril 12/2019 without a significant improvement in his blood pressure.  At his initial visit we discussed the further work-up for secondary causes, but given his lack of insurance we deferred renal Dopplers and additional testing.  We did start him on spironolactone 25 mg daily and discussed the importance of increasing exercise and limiting sodium.  He was not seen again until he followed up with Joni Reining on 11/2020.  He had been off his medication but recently got a new job with insurance and his medications were reordered.  He also ordered an echo 12/2020 that revealed LVEF 60 to 65% with severe LVH and grade 1 diastolic dysfunction.  At his follow-up visit 12/2020 his blood pressure was 130/80 and he was feeling well.  He had also lost 12 pounds and was really focused on diet and exercise.  He was referred for a sleep study and started on CPAP.  When he saw Dr. Tresa Endo 04/2021 he noted that he was feeling much  better since starting treatment.  Since his last visit his wife passed away from cancer. At his last visit his BP was uncontrolled and Spironolactone was added.  Today, he states that he is doing okay from a CV perspective. He lost his job 1 month after his wife passed away in 01/25/22. He has not had any of his medications since that time. He wanted to follow up today to restart his medication regimen. He has since gotten a new job. He has lost 18lbs since February 2023 which he attributes to poor appetite and depression after his wife passed. He has been compliant with his CPAP and has had more energy with using it. He notes using topical Voltaren for his right shoulder pain with good control.  Previous antihypertensives: Amlodipine   Past Medical History:  Diagnosis Date   Morbid obesity (HCC) 09/14/2019   Obesity (BMI 30-39.9) 09/14/2019   OSA (obstructive sleep apnea) 09/09/2021   Snoring 01/09/2020    Past Surgical History:  Procedure Laterality Date   CHOLECYSTECTOMY      Current Medications: No outpatient medications have been marked as taking for the 07/14/22 encounter (Office Visit) with Chilton Si, MD.     Allergies:   Patient has no known allergies.   Social History   Socioeconomic History   Marital status: Single    Spouse name: Not on file   Number of children: Not  on file   Years of education: Not on file   Highest education level: Not on file  Occupational History   Not on file  Tobacco Use   Smoking status: Never   Smokeless tobacco: Never  Vaping Use   Vaping Use: Never used  Substance and Sexual Activity   Alcohol use: Yes   Drug use: No   Sexual activity: Not on file  Other Topics Concern   Not on file  Social History Narrative   Not on file   Social Determinants of Health   Financial Resource Strain: Not on file  Food Insecurity: No Food Insecurity (09/14/2019)   Hunger Vital Sign    Worried About Running Out of Food in the Last  Year: Never true    Ran Out of Food in the Last Year: Never true  Transportation Needs: No Transportation Needs (09/14/2019)   PRAPARE - Hydrologist (Medical): No    Lack of Transportation (Non-Medical): No  Physical Activity: Insufficiently Active (09/14/2019)   Exercise Vital Sign    Days of Exercise per Week: 7 days    Minutes of Exercise per Session: 20 min  Stress: No Stress Concern Present (09/14/2019)   [place]    Feeling of Stress : Not at all  Social Connections: Not on file     Family History: The patient's family history includes Appendicitis in his mother; HIV in his father.  ROS:   Please see the history of present illness. + Right shoulder pain + Weight loss    + Depression All other systems reviewed and are negative.  EKGs/Labs/Other Studies Reviewed:    EKG: EKG ordered today, 07/14/22.  The ekg ordered today demonstrates Sinus rhythm rate of 69bpm, 1st degree AV block, and LVH with repolarization abnormalities. 09/09/2021: sinus rhythm.  Rate 92 bpm.  LAD.  Incomplete LBBB.  LVH with repolarization abnormalities.   Echo 12/2020: 1. Left ventricular ejection fraction, by estimation, is 60 to 65%. Left  ventricular ejection fraction by PLAX is 61 %. The left ventricle has  normal function. The left ventricle has no regional wall motion  abnormalities. There is severe concentric left   ventricular hypertrophy. Left ventricular diastolic parameters are  consistent with Grade I diastolic dysfunction (impaired relaxation).   2. Right ventricular systolic function is normal. The right ventricular  size is normal.   3. The mitral valve is normal in structure. Trivial mitral valve  regurgitation. No evidence of mitral stenosis.   4. The aortic valve is tricuspid. Aortic valve regurgitation is trivial.  No aortic stenosis is present.   5. Aortic dilatation noted. There is  mild dilatation of the ascending  aorta, measuring 37 mm.   6. The inferior vena cava is normal in size with greater than 50%  respiratory variability, suggesting right atrial pressure of 3 mmHg.   Recent Labs: No results found for requested labs within last 365 days.   Recent Lipid Panel    Component Value Date/Time   CHOL 192 11/23/2020 1530   TRIG 128 11/23/2020 1530   HDL 43 11/23/2020 1530   CHOLHDL 4.5 11/23/2020 1530   LDLCALC 126 (H) 11/23/2020 1530    Physical Exam:     VS:  BP (!) 180/84 (BP Location: Left Arm, Patient Position: Sitting)   Pulse 69   Ht 6\' 9"  (2.057 m)   Wt (!) 360 lb 4.8 oz (163.4 kg)   BMI 38.61  kg/m  , BMI Body mass index is 38.61 kg/m. GENERAL:  Well appearing HEENT: Pupils equal round and reactive, fundi not visualized, oral mucosa unremarkable NECK:  No jugular venous distention, waveform within normal limits, carotid upstroke brisk and symmetric, no bruits LUNGS:  Clear to auscultation bilaterally HEART:  RRR.  PMI not displaced or sustained,S1 and S2 within normal limits, no S3, no S4, no clicks, no rubs, no murmurs ABD:  Flat, positive bowel sounds normal in frequency in pitch, no bruits, no rebound, no guarding, no midline pulsatile mass, no hepatomegaly, no splenomegaly EXT:  2 plus pulses throughout, no edema, no cyanosis no clubbing SKIN:  No rashes no nodules NEURO:  Cranial nerves II through XII grossly intact, motor grossly intact throughout PSYCH:  Cognitively intact, oriented to person place and time  ASSESSMENT:    1. Therapeutic drug monitoring   2. Essential hypertension   3. Obesity (BMI 30-39.9)     PLAN:    Essential hypertension Blood pressure uncontrolled.  This is because he has been out of all his antihypertensives due to job loss and stress from the death of his spouse.  Will resume his prior regimen of chlorthalidone, spironolactone, and amlodipine/benazepril.  Will start back checking his blood pressures at  home and bring to follow-up.  Obesity (BMI 30-39.9) Will start focusing on diet and exercise.  Check fasting lipids and CMP at follow-up.  Time spent: 40 minutes-Greater than 50% of this time was spent in counseling, explanation of diagnosis, planning of further management, and coordination of care.    Disposition:   f/u in 4-6 weeks with Christopher Coupe C. Oval Linsey, MD, [facility] or APP   Medication Adjustments/Labs and Tests Ordered: Current medicines are reviewed at length with the patient today.  Concerns regarding medicines are outlined above.  Orders Placed This Encounter  Procedures   Lipid panel   Comprehensive metabolic panel   EKG [id number]   Meds ordered this encounter  Medications   amLODipine-benazepril (LOTREL) 10-40 MG capsule    Sig: Take 1 capsule by mouth daily.    Dispense:  90 capsule    Refill:  2   chlorthalidone (HYGROTON) 25 MG tablet    Sig: Take 1 tablet (25 mg total) by mouth daily.    Dispense:  90 tablet    Refill:  2   spironolactone (ALDACTONE) 25 MG tablet    Sig: Take 1 tablet (25 mg total) by mouth daily.    Dispense:  90 tablet    Refill:  2    I,Christopher Bruce,acting as a scribe for Skeet Latch, MD.,have documented all relevant documentation on the behalf of Skeet Latch, MD,as directed by  Skeet Latch, MD while in the presence of Skeet Latch, MD.  I, [place] Linsey, MD have reviewed all documentation for this visit.  The documentation of the exam, diagnosis, procedures, and orders on 08/10/2022 are all accurate and complete.   Signed, Skeet Latch, MD  08/10/2022 7:37 AM    [place]

## 2022-07-14 NOTE — Patient Instructions (Addendum)
Medication Instructions:  HAVE SENT REFILLS OF YOUR MEDICATIONS TO PHARMACY   *If you need a refill on your cardiac medications before your next appointment, please call your pharmacy*   Lab Work: FASTING LP/CMET WHEN YOU RETURN IN 1 MONTH   If you have labs (blood work) drawn today and your tests are completely normal, you will receive your results only by: MyChart Message (if you have MyChart) OR A paper copy in the mail If you have any lab test that is abnormal or we need to change your treatment, we will call you to review the results.   Testing/Procedures: NONE   Follow-Up: At [facility], you and your health needs are our priority.  As part of our continuing mission to provide you with exceptional heart care, we have created designated Provider Care Teams.  These Care Teams include your primary Cardiologist (physician) and Advanced Practice Providers (APPs -  Physician Assistants and Nurse Practitioners) who all work together to provide you with the care you need, when you need it.  We recommend signing up for the patient portal called "MyChart".  Sign up information is provided on this After Visit Summary.  MyChart is used to connect with patients for Virtual Visits (Telemedicine).  Patients are able to view lab/test results, encounter notes, upcoming appointments, etc.  Non-urgent messages can be sent to your provider as well.   To learn more about what you can do with MyChart, go to [email or web address].    Your next appointment:   4-6 week(s)  The format for your next appointment:   In Person  Provider:   Chilton Si, MD or Gillian Shields, NP

## 2022-07-14 NOTE — Assessment & Plan Note (Addendum)
Blood pressure uncontrolled.  This is because he has been out of all his antihypertensives due to job loss and stress from the death of his spouse.  Will resume his prior regimen of chlorthalidone, spironolactone, and amlodipine/benazepril.  Will start back checking his blood pressures at home and bring to follow-up.

## 2022-08-10 ENCOUNTER — Encounter (HOSPITAL_BASED_OUTPATIENT_CLINIC_OR_DEPARTMENT_OTHER): Payer: Self-pay | Admitting: Cardiovascular Disease

## 2022-08-10 NOTE — Assessment & Plan Note (Signed)
Will start focusing on diet and exercise.  Check fasting lipids and CMP at follow-up.

## 2022-08-11 ENCOUNTER — Ambulatory Visit (HOSPITAL_BASED_OUTPATIENT_CLINIC_OR_DEPARTMENT_OTHER): Payer: Managed Care, Other (non HMO) | Admitting: Family

## 2022-08-11 ENCOUNTER — Encounter (HOSPITAL_BASED_OUTPATIENT_CLINIC_OR_DEPARTMENT_OTHER): Payer: Self-pay

## 2022-08-11 LAB — COMPREHENSIVE METABOLIC PANEL
ALT: 18 IU/L (ref 0–44)
AST: 17 IU/L (ref 0–40)
Albumin/Globulin Ratio: 1.9 (ref 1.2–2.2)
Albumin: 4.2 g/dL (ref 4.1–5.1)
Alkaline Phosphatase: 86 IU/L (ref 44–121)
BUN/Creatinine Ratio: 14 (ref 9–20)
BUN: 14 mg/dL (ref 6–24)
Bilirubin Total: 0.4 mg/dL (ref 0.0–1.2)
CO2: 24 mmol/L (ref 20–29)
Calcium: 9.7 mg/dL (ref 8.7–10.2)
Chloride: 102 mmol/L (ref 96–106)
Creatinine, Ser: 1.03 mg/dL (ref 0.76–1.27)
Globulin, Total: 2.2 g/dL (ref 1.5–4.5)
Glucose: 101 mg/dL — ABNORMAL HIGH (ref 70–99)
Potassium: 3.7 mmol/L (ref 3.5–5.2)
Sodium: 141 mmol/L (ref 134–144)
Total Protein: 6.4 g/dL (ref 6.0–8.5)
eGFR: 91 mL/min/{1.73_m2} (ref >59)

## 2022-08-11 LAB — LIPID PANEL
Chol/HDL Ratio: 4.6 ratio (ref 0.0–5.0)
Cholesterol, Total: 179 mg/dL (ref 100–199)
HDL: 39 mg/dL — ABNORMAL LOW (ref >39)
LDL Chol Calc (NIH): 121 mg/dL — ABNORMAL HIGH (ref 0–99)
Triglycerides: 103 mg/dL (ref 0–149)
VLDL Cholesterol Cal: 19 mg/dL (ref 5–40)

## 2022-08-11 NOTE — Progress Notes (Deleted)
Advanced Hypertension Clinic Assessment:    Date:  08/11/2022   ID:  Christopher Bruce, DOB Dec 06, 1975, MRN [id number]  PCP:  Skeet Latch, MD  Cardiologist:  None  Nephrologist:  Referring MD: Skeet Latch, MD   CC: Hypertension  History of Present Illness:    Christopher Bruce is a 47 y.o. male with a hx of obesity, hypertension. Last seen 07/14/22 by Dr. Oval Linsey. Established with ADV HTN clinic 09/2019. Initially diagnosed with hypertension at age 32. Previously well controlled in [place] while taking Amlodipine. Moved to [place] and did not have insurance and no medications. At initial visit, Amlodipine resumed. Chlorthalidone added 10/2019 and Benazepril 12/2019 without significant improvement. Previously declined secondary workup due to lack of insurance. Saw Jory Sims, NP 11/2020 and was off medication but recently got job with insurance and medications reordered. Echo 12/2020 LVEF 60-65%, severe LVH, gr1DD. At follow up 12/2020 BP well controlled. Referred for sleep study and started on CPAP.  Last seen 07/14/22 by Dr. Oval Linsey. His wife had passed away from cancer and he had lost his job one month later and been unable to obtain medications. He had since gotten a job and chlorthalidone, spironolactone, amlodipine/benazepril were resumed  ***  Previous antihypertensives: Amlodipine  Past Medical History:  Diagnosis Date   Morbid obesity ([place]) 09/14/2019   Obesity (BMI 30-39.9) 09/14/2019   OSA (obstructive sleep apnea) 09/09/2021   Snoring 01/09/2020    Past Surgical History:  Procedure Laterality Date   CHOLECYSTECTOMY      Current Medications: No outpatient medications have been marked as taking for the 08/11/22 encounter (Appointment) with Loel Dubonnet, NP.     Allergies:   Patient has no known allergies.   Social History   Socioeconomic History   Marital status: Single    Spouse name: Not on file   Number of children: Not on file   Years of education:  Not on file   Highest education level: Not on file  Occupational History   Not on file  Tobacco Use   Smoking status: Never   Smokeless tobacco: Never  Vaping Use   Vaping Use: Never used  Substance and Sexual Activity   Alcohol use: Yes   Drug use: No   Sexual activity: Not on file  Other Topics Concern   Not on file  Social History Narrative   Not on file   Social Determinants of Health   Financial Resource Strain: Not on file  Food Insecurity: No Food Insecurity (09/14/2019)   Hunger Vital Sign    Worried About Running Out of Food in the Last Year: Never true    Ran Out of Food in the Last Year: Never true  Transportation Needs: No Transportation Needs (09/14/2019)   PRAPARE - Hydrologist (Medical): No    Lack of Transportation (Non-Medical): No  Physical Activity: Insufficiently Active (09/14/2019)   Exercise Vital Sign    Days of Exercise per Week: 7 days    Minutes of Exercise per Session: 20 min  Stress: No Stress Concern Present (09/14/2019)   [place]    Feeling of Stress : Not at all  Social Connections: Not on file     Family History: The patient's family history includes Appendicitis in his mother; HIV in his father.  ROS:   Please see the history of present illness.    *** All other systems reviewed and are negative.  EKGs/Labs/Other Studies Reviewed:  EKG:  EKG is not ordered today  Recent Labs: No results found for requested labs within last 365 days.   Recent Lipid Panel    Component Value Date/Time   CHOL 192 11/23/2020 1530   TRIG 128 11/23/2020 1530   HDL 43 11/23/2020 1530   CHOLHDL 4.5 11/23/2020 1530   LDLCALC 126 (H) 11/23/2020 1530    Physical Exam:   VS:  There were no vitals taken for this visit. , BMI There is no height or weight on file to calculate BMI. GENERAL:  Well appearing HEENT: Pupils equal round and reactive, fundi not  visualized, oral mucosa unremarkable NECK:  No jugular venous distention, waveform within normal limits, carotid upstroke brisk and symmetric, no bruits, no thyromegaly LYMPHATICS:  No cervical adenopathy LUNGS:  Clear to auscultation bilaterally HEART:  RRR.  PMI not displaced or sustained,S1 and S2 within normal limits, no S3, no S4, no clicks, no rubs, *** murmurs ABD:  Flat, positive bowel sounds normal in frequency in pitch, no bruits, no rebound, no guarding, no midline pulsatile mass, no hepatomegaly, no splenomegaly EXT:  2 plus pulses throughout, no edema, no cyanosis no clubbing SKIN:  No rashes no nodules NEURO:  Cranial nerves II through XII grossly intact, motor grossly intact throughout PSYCH:  Cognitively intact, oriented to person place and time   ASSESSMENT/PLAN:    HTN -   Obesity -   Screening for Secondary Hypertension: { Click here to document screening for secondary causes of HTN  :[id number]}    Relevant Labs/Studies:    Latest Ref Rng & Units 11/23/2020    3:30 PM 11/10/2019   10:03 AM 09/14/2019    4:54 PM  Basic Labs  Sodium 134 - 144 mmol/L 141  143  142   Potassium 3.5 - 5.2 mmol/L 4.0  3.9  4.0   Creatinine 0.76 - 1.27 mg/dL 0.24  0.97  3.53                    Disposition:    FU with MD/PharmD in {gen number [id number]:[id number]} {Days to years:10300}    Medication Adjustments/Labs and Tests Ordered: Current medicines are reviewed at length with the patient today.  Concerns regarding medicines are outlined above.  No orders of the defined types were placed in this encounter.  No orders of the defined types were placed in this encounter.    Signed, Alver Sorrow, NP  08/11/2022 7:37 AM    [facility]

## 2022-08-18 ENCOUNTER — Telehealth (HOSPITAL_BASED_OUTPATIENT_CLINIC_OR_DEPARTMENT_OTHER): Payer: Self-pay

## 2022-08-18 DIAGNOSIS — E7800 Pure hypercholesterolemia, unspecified: Secondary | ICD-10-CM

## 2022-08-18 MED ORDER — ROSUVASTATIN CALCIUM 10 MG PO TABS: 10.0000 mg | ORAL_TABLET | Freq: Every day | ORAL | 3 refills | Status: DC

## 2022-08-18 NOTE — Telephone Encounter (Addendum)
Seen by patient Christopher Bruce on 08/16/2022  3:54 PM, orders placed and follow up mychart message sent to patient   ----- Message from Loel Dubonnet, NP sent at 08/16/2022  3:53 PM EST ----- Normal kidneys, liver, electrolytes. LDL (bad cholesterol) of 121. The 10-year ASCVD risk score (Arnett DK, et al., 2019) is: 13.2%. Recommend start Rosuvastatin 10mg  daily with FLP/LFT in 3 months.

## 2022-08-28 ENCOUNTER — Ambulatory Visit (HOSPITAL_BASED_OUTPATIENT_CLINIC_OR_DEPARTMENT_OTHER): Payer: Managed Care, Other (non HMO) | Admitting: Family

## 2022-08-28 ENCOUNTER — Encounter (HOSPITAL_BASED_OUTPATIENT_CLINIC_OR_DEPARTMENT_OTHER): Payer: Self-pay | Admitting: Family

## 2022-08-28 VITALS — BP 125/77 | HR 76 | Ht >= 80 in | Wt 347.7 lb

## 2022-08-28 DIAGNOSIS — E782 Mixed hyperlipidemia: Secondary | ICD-10-CM

## 2022-08-28 DIAGNOSIS — I10 Essential (primary) hypertension: Secondary | ICD-10-CM

## 2022-08-28 DIAGNOSIS — E669 Obesity, unspecified: Secondary | ICD-10-CM

## 2022-08-28 NOTE — Patient Instructions (Signed)
Medication Instructions:  Your Physician recommend you continue on your current medication as directed.    Please start Rosuvastatin!    Labwork: Please return for Lab work in 3 months! You already have these slips!    You may come to the...   [facility] (3rd floor) [address] [place], [place] [address]  Open: 8am-Noon and 1pm-4:30pm  Please ring the doorbell on the small table when you exit the elevator and the Lab Tech will come get you  [place] at [facility] [address] [place], [place], [place] [address] Open: 8am-1pm, then 2pm-4:30pm   [place]- Please see attached locations sheet stapled to your lab work with address and hours.   Follow-Up: Please follow up in 4 months in ADV HTN CLINIC with either Dr. Oval Linsey, Laurann Montana, NP or PharmD

## 2022-08-28 NOTE — Progress Notes (Signed)
Advanced Hypertension Clinic Assessment:    Date:  08/28/2022   ID:  Christopher Bruce, DOB 01/10/1976, MRN [id number]  PCP:  Skeet Latch, MD  Cardiologist:  None  Nephrologist:  Referring MD: Skeet Latch, MD   CC: Hypertension  History of Present Illness:    Christopher Bruce is a 47 y.o. male with a hx of obesity, hyperlipidemia, hypertension. Last seen 07/14/22 by Dr. Oval Linsey. Established with ADV HTN clinic 09/2019. Initially diagnosed with hypertension at age 33. Previously well controlled in [place] while taking Amlodipine. Moved to [place] and did not have insurance and no medications. At initial visit, Amlodipine resumed. Chlorthalidone added 10/2019 and Benazepril 12/2019 without significant improvement. Previously declined secondary workup due to lack of insurance. Saw Jory Sims, NP 11/2020 and was off medication but recently got job with insurance and medications reordered. Echo 12/2020 LVEF 60-65%, severe LVH, gr1DD. At follow up 12/2020 BP well controlled. Referred for sleep study and started on CPAP.  Last seen 07/14/22 by Dr. Oval Linsey. His wife had passed away from cancer and he had lost his job one month later and been unable to obtain medications. He had since gotten a job and chlorthalidone, spironolactone, amlodipine/benazepril were resumed  Labs 08/16/2022 LDL 121.  He was recommended to start rosuvastatin 10 mg daily.  Presents today for follow up independently. Has not been checking BP at home regularly as notes machine doesn't fit his arm well.  He has been working on weight loss by modifying his diet.  Excited for his brother's upcoming wedding in March in [place] which is motivating weight loss.  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  Reports no shortness of breath nor dyspnea on exertion. Reports no chest pain, pressure, or tightness. No edema, orthopnea, PND. Reports no palpitations.    Previous  antihypertensives: Amlodipine  Past Medical History:  Diagnosis Date   Morbid obesity ([place]) 09/14/2019   Obesity (BMI 30-39.9) 09/14/2019   OSA (obstructive sleep apnea) 09/09/2021   Snoring 01/09/2020    Past Surgical History:  Procedure Laterality Date   CHOLECYSTECTOMY      Current Medications: Current Meds  Medication Sig   amLODipine-benazepril (LOTREL) 10-40 MG capsule Take 1 capsule by mouth daily.   chlorthalidone (HYGROTON) 25 MG tablet Take 1 tablet (25 mg total) by mouth daily.   spironolactone (ALDACTONE) 25 MG tablet Take 1 tablet (25 mg total) by mouth daily.     Allergies:   Patient has no known allergies.   Social History   Socioeconomic History   Marital status: Single    Spouse name: Not on file   Number of children: Not on file   Years of education: Not on file   Highest education level: Not on file  Occupational History   Not on file  Tobacco Use   Smoking status: Never   Smokeless tobacco: Never  Vaping Use   Vaping Use: Never used  Substance and Sexual Activity   Alcohol use: Yes   Drug use: No   Sexual activity: Not on file  Other Topics Concern   Not on file  Social History Narrative   Not on file   Social Determinants of Health   Financial Resource Strain: Not on file  Food Insecurity: No Food Insecurity (09/14/2019)   Hunger Vital Sign    Worried About Running Out of Food in the Last Year: Never true    Ran Out of Food in the Last  Year: Never true  Transportation Needs: No Transportation Needs (09/14/2019)   PRAPARE - Administrator, Civil Service (Medical): No    Lack of Transportation (Non-Medical): No  Physical Activity: Insufficiently Active (09/14/2019)   Exercise Vital Sign    Days of Exercise per Week: 7 days    Minutes of Exercise per Session: 20 min  Stress: No Stress Concern Present (09/14/2019)   [name] of Occupational Health - Occupational Stress Questionnaire    Feeling of Stress : Not at all   Social Connections: Not on file     Family History: The patient's family history includes Appendicitis in his mother; HIV in his father.  ROS:   Please see the history of present illness.     All other systems reviewed and are negative.  EKGs/Labs/Other Studies Reviewed:    EKG:  EKG is not ordered today  Recent Labs: 08/11/2022: ALT 18; BUN 14; Creatinine, Ser 1.03; Potassium 3.7; Sodium 141   Recent Lipid Panel    Component Value Date/Time   CHOL 179 08/11/2022 0812   TRIG 103 08/11/2022 0812   HDL 39 (L) 08/11/2022 0812   CHOLHDL 4.6 08/11/2022 0812   LDLCALC 121 (H) 08/11/2022 0812    Physical Exam:   VS:  BP 125/77   Pulse 76   Ht 6\' 9"  (2.057 m)   Wt (!) 347 lb 11.2 oz (157.7 kg)   BMI 37.26 kg/m  , BMI Body mass index is 37.26 kg/m. GENERAL:  Well appearing, overweight HEENT: Pupils equal round and reactive, fundi not visualized, oral mucosa unremarkable NECK:  No jugular venous distention, waveform within normal limits, carotid upstroke brisk and symmetric, no bruits, no thyromegaly LYMPHATICS:  No cervical adenopathy LUNGS:  Clear to auscultation bilaterally HEART:  RRR.  PMI not displaced or sustained,S1 and S2 within normal limits, no S3, no S4, no clicks, no rubs, no murmurs ABD:  Flat, positive bowel sounds normal in frequency in pitch, no bruits, no rebound, no guarding, no midline pulsatile mass, no hepatomegaly, no splenomegaly EXT:  2 plus pulses throughout, no edema, no cyanosis no clubbing SKIN:  No rashes no nodules NEURO:  Cranial nerves II through XII grossly intact, motor grossly intact throughout PSYCH:  Cognitively intact, oriented to person place and time   ASSESSMENT/PLAN:    HTN -  BP well controlled. Continue current antihypertensive regimen including amlodipine-benazepril 10-40 mg, chlorthalidone 25 mg, spironolactone 25 mg.  HLD-08/2022 LDL 121 with 10-year ASCVD risk score 13.2%.  He was started on rosuvastatin 10 mg daily.  Plan for  repeat FLP/LFT in 3 months which has previously been ordered.  Obesity -congratulated on weight loss.  Continued weight loss via diet and exercise encouraged. Discussed the impact being overweight would have on cardiovascular risk.   Screening for Secondary Hypertension:     Relevant Labs/Studies:    Latest Ref Rng & Units 08/11/2022    8:12 AM 11/23/2020    3:30 PM 11/10/2019   10:03 AM  Basic Labs  Sodium 134 - 144 mmol/L 141  141  143   Potassium 3.5 - 5.2 mmol/L 3.7  4.0  3.9   Creatinine 0.76 - 1.27 mg/dL 01/10/2020  1.51  7.61                    Disposition:    FU with MD/PharmD in 4 months    Medication Adjustments/Labs and Tests Ordered: Current medicines are reviewed at length with the patient today.  Concerns regarding medicines are outlined above.  No orders of the defined types were placed in this encounter.  No orders of the defined types were placed in this encounter.    Signed, Loel Dubonnet, NP  08/28/2022 8:22 AM    [facility]

## 2022-09-01 ENCOUNTER — Telehealth (HOSPITAL_BASED_OUTPATIENT_CLINIC_OR_DEPARTMENT_OTHER): Payer: Self-pay

## 2022-09-01 DIAGNOSIS — E7800 Pure hypercholesterolemia, unspecified: Secondary | ICD-10-CM

## 2022-09-01 MED ORDER — ROSUVASTATIN CALCIUM 10 MG PO TABS: 10.0000 mg | ORAL_TABLET | Freq: Every day | ORAL | 3 refills | Status: DC

## 2022-09-01 NOTE — Addendum Note (Signed)
Addended by: Gerald Stabs on: 09/01/2022 01:09 PM   Modules accepted: Orders

## 2022-09-01 NOTE — Telephone Encounter (Signed)
Patient called in and stated that he went to pick up Crestor from the pharmacy and they told him it was going to be $178.   Everything currently on hold at the pharmacy, per the Pharmacist they do not have any insurance on file. Patients last insurance was [facility] who is only contracted to use [facility].    Spoke with patient and he is going to look up what is the most close [facility] to him and let us know to send script to that pharmacy.

## 2022-12-12 ENCOUNTER — Other Ambulatory Visit (HOSPITAL_BASED_OUTPATIENT_CLINIC_OR_DEPARTMENT_OTHER): Payer: Self-pay | Admitting: *Deleted

## 2022-12-12 MED ORDER — SPIRONOLACTONE 25 MG PO TABS: 25.0000 mg | ORAL_TABLET | Freq: Every day | ORAL | 1 refills | Status: DC

## 2022-12-12 MED ORDER — CHLORTHALIDONE 25 MG PO TABS: 25.0000 mg | ORAL_TABLET | Freq: Every day | ORAL | 1 refills | Status: DC

## 2022-12-12 MED ORDER — AMLODIPINE BESY-BENAZEPRIL HCL 10-40 MG PO CAPS: 1.0000 | ORAL_CAPSULE | Freq: Every day | ORAL | 1 refills | Status: DC

## 2022-12-12 NOTE — Telephone Encounter (Signed)
Rx(s) sent to pharmacy electronically.  

## 2022-12-25 ENCOUNTER — Encounter (HOSPITAL_BASED_OUTPATIENT_CLINIC_OR_DEPARTMENT_OTHER): Payer: Self-pay | Admitting: Family

## 2022-12-25 ENCOUNTER — Other Ambulatory Visit (HOSPITAL_COMMUNITY): Payer: Self-pay

## 2022-12-25 ENCOUNTER — Ambulatory Visit (INDEPENDENT_AMBULATORY_CARE_PROVIDER_SITE_OTHER): Payer: Managed Care, Other (non HMO) | Admitting: Family

## 2022-12-25 ENCOUNTER — Telehealth: Payer: Self-pay

## 2022-12-25 VITALS — BP 128/79 | HR 71 | Ht >= 80 in | Wt 343.2 lb

## 2022-12-25 DIAGNOSIS — Z6836 Body mass index (BMI) 36.0-36.9, adult: Secondary | ICD-10-CM | POA: Diagnosis not present

## 2022-12-25 DIAGNOSIS — E782 Mixed hyperlipidemia: Secondary | ICD-10-CM | POA: Diagnosis not present

## 2022-12-25 DIAGNOSIS — I10 Essential (primary) hypertension: Secondary | ICD-10-CM

## 2022-12-25 DIAGNOSIS — Z9189 Other specified personal risk factors, not elsewhere classified: Secondary | ICD-10-CM | POA: Diagnosis not present

## 2022-12-25 MED ORDER — SEMAGLUTIDE-WEIGHT MANAGEMENT 1 MG/0.5ML ~~LOC~~ SOAJ: 1.0000 mg | SUBCUTANEOUS | 0 refills | Status: DC

## 2022-12-25 MED ORDER — SEMAGLUTIDE-WEIGHT MANAGEMENT 2.4 MG/0.75ML ~~LOC~~ SOAJ: 2.4000 mg | SUBCUTANEOUS | 0 refills | Status: DC

## 2022-12-25 MED ORDER — SEMAGLUTIDE-WEIGHT MANAGEMENT 1.7 MG/0.75ML ~~LOC~~ SOAJ: 1.7000 mg | SUBCUTANEOUS | 0 refills | Status: DC

## 2022-12-25 MED ORDER — SEMAGLUTIDE-WEIGHT MANAGEMENT 0.5 MG/0.5ML ~~LOC~~ SOAJ: 0.5000 mg | SUBCUTANEOUS | 0 refills | Status: DC

## 2022-12-25 MED ORDER — SEMAGLUTIDE-WEIGHT MANAGEMENT 0.25 MG/0.5ML ~~LOC~~ SOAJ: 0.2500 mg | SUBCUTANEOUS | 0 refills | Status: DC

## 2022-12-25 NOTE — Telephone Encounter (Signed)
   (  See test claim below)/Not covered by pts plan at all. Member would be responsible for 100% of the cost

## 2022-12-25 NOTE — Patient Instructions (Signed)
Medication Instructions:  Your physician has recommended you make the following change in your medication:   Start: UEAVWU  0.25mg  weekly x4 weeks  0.5mg  weekly x4 weeks  1.0mg  weekly x4 weeks  1.7mg  weekly x4 weeks  2.4mg  weekly x4 weeks We have sent this in, it will likely need prior auth, DO NOT PICK UP FROM THE PHARMACY UNTIL WE TELL YOU TO.    Labwork: Your physician recommends that you return for lab work TODAY!   Follow-Up: 4 MONTHS IN ADV HTN CLINIC

## 2022-12-25 NOTE — Progress Notes (Signed)
Advanced Hypertension [facility] Assessment:    Date:  12/25/2022   ID:  Christopher Bruce, DOB 03-Jan-1976, MRN [id number]  PCP:  Chilton Si, MD  Cardiologist:  None  Nephrologist:  Referring MD: Chilton Si, MD   CC: Hypertension  History of Present Illness:    Christopher Bruce is a 47 y.o. male with a hx of obesity, hyperlipidemia, hypertension. Last seen 07/14/22 by Dr. Duke Salvia. Established with [facility] HT[facility] clinic 09/2019. Initially diagnosed with hypertension at age 54. Previously well controlled in [place] while taking Amlodipine. Moved to [place] and did not have insurance and no medications. At initial visit, Amlodipine resumed. Chlorthalidone added 10/2019 and Benazepril 12/2019 without significant improvement. Previously declined secondary workup due to lack of insurance. Saw Joni Reining, NP 11/2020 and was off medication but recently got job with insurance and medications reordered. Echo 12/2020 LVEF 60-65%, severe LVH, gr1DD. At follow up 12/2020 BP well controlled. Referred for sleep study and started on CPAP.  Seen 07/14/22 by Dr. Duke Salvia. His wife had passed away from cancer and he had lost his job one month later and been unable to obtain medications. He had since gotten a job and chlorthalidone, spironolactone, amlodipine/benazepril were resumed. Labs 08/16/2022 LDL 121.  He was recommended to start rosuvastatin 10 mg daily.  Less than 08/28/2022 with BP at goal less than 130/80.  He was recommended to continue amlodipine-benazepril 10-40 mg, chlorthalidone 25 mg, spironolactone 25 mg daily.  Presents today for follow up independently. Has not been checking BP at home regularly.  He has continued working on weight loss by modifying his diet and trying to move at work with weight loss of 5 pounds since last clinic visit 4 months ago.  Interested in GLP-1.  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 Reports no chest pain, pressure, or tightness. No edema, orthopnea, PND. Reports no palpitations.  Notes he was cramping yesterday which he attributes to not drinking enough water yesterday.   Previous antihypertensives: Amlodipine  Past Medical History:  Diagnosis Date   Morbid obesity (HCC) 09/14/2019   Obesity (BMI 30-39.9) 09/14/2019   OSA (obstructive sleep apnea) 09/09/2021   Snoring 01/09/2020    Past Surgical History:  Procedure Laterality Date   CHOLECYSTECTOMY      Current Medications: Current Meds  Medication Sig   amLODipine-benazepril (LOTREL) 10-40 MG capsule Take 1 capsule by mouth daily.   chlorthalidone (HYGROTON) 25 MG tablet Take 1 tablet (25 mg total) by mouth daily.   rosuvastatin (CRESTOR) 10 MG tablet Take 1 tablet (10 mg total) by mouth daily.   Semaglutide-Weight Management 0.25 MG/0.5ML SOAJ Inject 0.25 mg into the skin once a week for 28 days.   [START ON 01/23/2023] Semaglutide-Weight Management 0.5 MG/0.5ML SOAJ Inject 0.5 mg into the skin once a week for 28 days.   [START ON 02/21/2023] Semaglutide-Weight Management 1 MG/0.5ML SOAJ Inject 1 mg into the skin once a week for 28 days.   [START ON 03/22/2023] Semaglutide-Weight Management 1.7 MG/0.75ML SOAJ Inject 1.7 mg into the skin once a week for 28 days.   [START ON 04/20/2023] Semaglutide-Weight Management 2.4 MG/0.75ML SOAJ Inject 2.4 mg into the skin once a week for 28 days.   spironolactone (ALDACTONE) 25 MG tablet Take 1 tablet (25 mg total) by mouth daily.     Allergies:   Patient has no known allergies.   Social History   Socioeconomic  History   Marital status: Single    Spouse name: Not on file   Number of children: Not on file   Years of education: Not on file   Highest education level: Not on file  Occupational History   Not on file  Tobacco Use   Smoking status: Never   Smokeless tobacco: Never  Vaping Use   Vaping Use: Never used  Substance and Sexual Activity    Alcohol use: Yes   Drug use: No   Sexual activity: Not on file  Other Topics Concern   Not on file  Social History Narrative   Not on file   Social Determinants of Health   Financial Resource Strain: Not on file  Food Insecurity: No Food Insecurity (09/14/2019)   Hunger Vital Sign    Worried About Running Out of Food in the Last Year: Never true    Ran Out of Food in the Last Year: Never true  Transportation Needs: No Transportation Needs (09/14/2019)   PRAPARE - Administrator, Civil Service (Medical): No    Lack of Transportation (Non-Medical): No  Physical Activity: Insufficiently Active (09/14/2019)   Exercise Vital Sign    Days of Exercise per Week: 7 days    Minutes of Exercise per Session: 20 min  Stress: No Stress Concern Present (09/14/2019)   [name] of Occupational Health - Occupational Stress Questionnaire    Feeling of Stress : Not at all  Social Connections: Not on file     Family History: The patient's family history includes Appendicitis in his mother; HIV in his father.  ROS:   Please see the history of present illness.     All other systems reviewed and are negative.  EKGs/Labs/Other Studies Reviewed:    EKG:  EKG is not ordered today  Recent Labs: 08/11/2022: ALT 18; BUN 14; Creatinine, Ser 1.03; Potassium 3.7; Sodium 141   Recent Lipid Panel    Component Value Date/Time   CHOL 179 08/11/2022 0812   TRIG 103 08/11/2022 0812   HDL 39 (L) 08/11/2022 0812   CHOLHDL 4.6 08/11/2022 0812   LDLCALC 121 (H) 08/11/2022 0812    Physical Exam:   VS:  BP 128/79 (BP Location: Right Arm, Patient Position: Sitting, Cuff Size: Large)   Pulse 71   Ht 6\' 9"  (2.057 m)   Wt (!) 343 lb 3.2 oz (155.7 kg)   SpO2 94%   BMI 36.78 kg/m  , BMI Body mass index is 36.78 kg/m. GENERAL:  Well appearing, overweight HEENT: Pupils equal round and reactive, fundi not visualized, oral mucosa unremarkable NECK:  No jugular venous distention, waveform  within normal limits, carotid upstroke brisk and symmetric, no bruits, no thyromegaly LYMPHATICS:  No cervical adenopathy LUNGS:  Clear to auscultation bilaterally HEART:  RRR.  PMI not displaced or sustained,S1 and S2 within normal limits, no S3, no S4, no clicks, no rubs, no murmurs ABD:  Flat, positive bowel sounds normal in frequency in pitch, no bruits, no rebound, no guarding, no midline pulsatile mass, no hepatomegaly, no splenomegaly EXT:  2 plus pulses throughout, no edema, no cyanosis no clubbing SKIN:  No rashes no nodules NEURO:  Cranial nerves II through XII grossly intact, motor grossly intact throughout PSYCH:  Cognitively intact, oriented to person place and time  ASSESSMENT/PLAN:    HTN -  BP well controlled. Continue current antihypertensive regimen including amlodipine-benazepril 10-40 mg, chlorthalidone 25 mg, spironolactone 25 mg.  HLD-08/2022 LDL 121 with 10-year ASCVD risk  score 13.2%.  He was started on rosuvastatin 10 mg daily.  Update FLP/LFT today.  If LDL not at goal plan to increase dose.  Obesity /BMI 36/cardiovascular risk factor-congratulated on 5 pound weight loss since last clinic visit.  Continued weight loss via diet and exercise encouraged. Discussed the impact being overweight would have on cardiovascular risk.  Given elevated cardiovascular risk, BMI 36 will Rx Wegovy.  Screening for Secondary Hypertension:     Relevant Labs/Studies:    Latest Ref Rng & Units 08/11/2022    8:12 AM 11/23/2020    3:30 PM 11/10/2019   10:03 AM  Basic Labs  Sodium 134 - 144 mmol/L 141  141  143   Potassium 3.5 - 5.2 mmol/L 3.7  4.0  3.9   Creatinine 0.76 - 1.27 mg/dL 8.29  5.62  1.30                    Disposition:    FU with MD/PharmD in 4 months    Medication Adjustments/Labs and Tests Ordered: Current medicines are reviewed at length with the patient today.  Concerns regarding medicines are outlined above.  No orders of the defined types were placed in this  encounter.  Meds ordered this encounter  Medications   Semaglutide-Weight Management 0.25 MG/0.5ML SOAJ    Sig: Inject 0.25 mg into the skin once a week for 28 days.    Dispense:  2 mL    Refill:  0    Order Specific Question:   Supervising Provider    Answer:   Jodelle Red [[id number]]   Semaglutide-Weight Management 0.5 MG/0.5ML SOAJ    Sig: Inject 0.5 mg into the skin once a week for 28 days.    Dispense:  2 mL    Refill:  0    Order Specific Question:   Supervising Provider    Answer:   Jodelle Red [[id number]]   Semaglutide-Weight Management 1 MG/0.5ML SOAJ    Sig: Inject 1 mg into the skin once a week for 28 days.    Dispense:  2 mL    Refill:  0    Order Specific Question:   Supervising Provider    Answer:   Jodelle Red [[id number]]   Semaglutide-Weight Management 1.7 MG/0.75ML SOAJ    Sig: Inject 1.7 mg into the skin once a week for 28 days.    Dispense:  3 mL    Refill:  0    Order Specific Question:   Supervising Provider    Answer:   Jodelle Red [[id number]]   Semaglutide-Weight Management 2.4 MG/0.75ML SOAJ    Sig: Inject 2.4 mg into the skin once a week for 28 days.    Dispense:  3 mL    Refill:  0    Order Specific Question:   Supervising Provider    Answer:   Jodelle Red [[id number]]     Signed, Alver Sorrow, NP  12/25/2022 8:28 AM    [facility]

## 2022-12-26 ENCOUNTER — Other Ambulatory Visit (HOSPITAL_COMMUNITY): Payer: Self-pay

## 2022-12-26 ENCOUNTER — Telehealth: Payer: Self-pay

## 2022-12-26 ENCOUNTER — Other Ambulatory Visit (HOSPITAL_BASED_OUTPATIENT_CLINIC_OR_DEPARTMENT_OTHER): Payer: Self-pay

## 2022-12-26 LAB — HEPATIC FUNCTION PANEL
ALT: 15 IU/L (ref 0–44)
AST: 25 IU/L (ref 0–40)
Albumin: 4.8 g/dL (ref 4.1–5.1)
Alkaline Phosphatase: 99 IU/L (ref 44–121)
Bilirubin Total: 0.9 mg/dL (ref 0.0–1.2)
Bilirubin, Direct: 0.23 mg/dL (ref 0.00–0.40)
Total Protein: 7.1 g/dL (ref 6.0–8.5)

## 2022-12-26 LAB — LIPID PANEL
Chol/HDL Ratio: 2.9 ratio (ref 0.0–5.0)
Cholesterol, Total: 159 mg/dL (ref 100–199)
HDL: 54 mg/dL (ref >39)
LDL Chol Calc (NIH): 90 mg/dL (ref 0–99)
Triglycerides: 78 mg/dL (ref 0–149)
VLDL Cholesterol Cal: 15 mg/dL (ref 5–40)

## 2022-12-26 NOTE — Telephone Encounter (Signed)
     TEST CLAIM:

## 2022-12-26 NOTE — Telephone Encounter (Signed)
Per Rx Prior auth team, no weight loss medications covered for patient. Medication removed from med list.    Alver Sorrow, NP  Marlene Lard, RN Normal liver function. Cholesterol improved from previous! Has reduced ASCVD risk from 13.2% to 6.6% which is fantastic. Good result! Continue current medications.  Called patient and left message with the above information.

## 2023-04-09 ENCOUNTER — Encounter (HOSPITAL_BASED_OUTPATIENT_CLINIC_OR_DEPARTMENT_OTHER): Payer: Managed Care, Other (non HMO) | Admitting: Family

## 2023-04-09 NOTE — Progress Notes (Deleted)
Advanced Hypertension Clinic Assessment:    Date:  04/09/2023   ID:  Christopher Bruce, DOB 06-08-1976, MRN [id number]  PCP:  Chilton Si, MD  Cardiologist:  None  Nephrologist:  Referring MD: Chilton Si, MD   CC: Hypertension  History of Present Illness:    Christopher Bruce is a 47 y.o. male with a hx of obesity, hyperlipidemia, hypertension. Last seen 07/14/22 by Dr. Duke Salvia. Established with [facility] HTN clinic 09/2019. Initially diagnosed with hypertension at age 69. Previously well controlled in [place] while taking Amlodipine. Moved to [place] and did not have insurance and no medications. At initial visit, Amlodipine resumed. Chlorthalidone added 10/2019 and Benazepril 12/2019 without significant improvement. Previously declined secondary workup due to lack of insurance. Saw Joni Reining, NP 11/2020 and was off medication but recently got job with insurance and medications reordered. Echo 12/2020 LVEF 60-65%, severe LVH, gr1DD. At follow up 12/2020 BP well controlled. Referred for sleep study and started on CPAP.  Seen 07/14/22 by Dr. Duke Salvia. His wife had passed away from cancer and he had lost his job one month later and been unable to obtain medications. He had since gotten a job and chlorthalidone, spironolactone, amlodipine/benazepril were resumed. Labs 08/16/2022 LDL 121.  He was recommended to start rosuvastatin 10 mg daily.  Seen 08/28/2022 with BP at goal less than 130/80.  He was recommended to continue amlodipine-benazepril 10-40 mg, chlorthalidone 25 mg, spironolactone 25 mg daily.  At visit 12/25/2022 his BP was controlled.  He had lost 5 pounds and had goal to lose more, Wegovy per other station was initiated but unfortunately his insurance plan did not cover any weight loss medications.  Presents today for follow up independently. Has not been checking BP at home regularly.  He has continued working on weight loss by modifying his diet and trying to move at work with weight  loss of 5 pounds since last clinic visit 4 months ago.  Interested in GLP-1.  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 Reports no chest pain, pressure, or tightness. No edema, orthopnea, PND. Reports no palpitations.  Notes he was cramping yesterday which he attributes to not drinking enough water yesterday. ***  Previous antihypertensives: Amlodipine  Past Medical History:  Diagnosis Date   Morbid obesity (HCC) 09/14/2019   Obesity (BMI 30-39.9) 09/14/2019   OSA (obstructive sleep apnea) 09/09/2021   Snoring 01/09/2020    Past Surgical History:  Procedure Laterality Date   CHOLECYSTECTOMY      Current Medications: No outpatient medications have been marked as taking for the 04/09/23 encounter (Appointment) with Alver Sorrow, NP.     Allergies:   Patient has no known allergies.   Social History   Socioeconomic History   Marital status: Single    Spouse name: Not on file   Number of children: Not on file   Years of education: Not on file   Highest education level: Not on file  Occupational History   Not on file  Tobacco Use   Smoking status: Never   Smokeless tobacco: Never  Vaping Use   Vaping status: Never Used  Substance and Sexual Activity   Alcohol use: Yes   Drug use: No   Sexual activity: Not on file  Other Topics Concern   Not on file  Social History Narrative   Not on file   Social Determinants of Health   Financial Resource  Strain: Not on file  Food Insecurity: No Food Insecurity (09/14/2019)   Hunger Vital Sign    Worried About Running Out of Food in the Last Year: Never true    Ran Out of Food in the Last Year: Never true  Transportation Needs: No Transportation Needs (09/14/2019)   PRAPARE - Administrator, Civil Service (Medical): No    Lack of Transportation (Non-Medical): No  Physical Activity: Insufficiently Active (09/14/2019)    Exercise Vital Sign    Days of Exercise per Week: 7 days    Minutes of Exercise per Session: 20 min  Stress: No Stress Concern Present (09/14/2019)   [name] of Occupational Health - Occupational Stress Questionnaire    Feeling of Stress : Not at all  Social Connections: Not on file     Family History: The patient's family history includes Appendicitis in his mother; HIV in his father.  ROS:   Please see the history of present illness.     All other systems reviewed and are negative.  EKGs/Labs/Other Studies Reviewed:    EKG:  EKG is not ordered today  Recent Labs: 08/11/2022: BUN 14; Creatinine, Ser 1.03; Potassium 3.7; Sodium 141 12/25/2022: ALT 15   Recent Lipid Panel    Component Value Date/Time   CHOL 159 12/25/2022 0832   TRIG 78 12/25/2022 0832   HDL 54 12/25/2022 0832   CHOLHDL 2.9 12/25/2022 0832   LDLCALC 90 12/25/2022 0832    Physical Exam:   VS:  There were no vitals taken for this visit. , BMI There is no height or weight on file to calculate BMI. GENERAL:  Well appearing, overweight HEENT: Pupils equal round and reactive, fundi not visualized, oral mucosa unremarkable NECK:  No jugular venous distention, waveform within normal limits, carotid upstroke brisk and symmetric, no bruits, no thyromegaly LYMPHATICS:  No cervical adenopathy LUNGS:  Clear to auscultation bilaterally HEART:  RRR.  PMI not displaced or sustained,S1 and S2 within normal limits, no S3, no S4, no clicks, no rubs, no murmurs ABD:  Flat, positive bowel sounds normal in frequency in pitch, no bruits, no rebound, no guarding, no midline pulsatile mass, no hepatomegaly, no splenomegaly EXT:  2 plus pulses throughout, no edema, no cyanosis no clubbing SKIN:  No rashes no nodules NEURO:  Cranial nerves II through XII grossly intact, motor grossly intact throughout PSYCH:  Cognitively intact, oriented to person place and time  ASSESSMENT/PLAN:    HTN -  BP well controlled. Continue  current antihypertensive regimen including amlodipine-benazepril 10-40 mg, chlorthalidone 25 mg, spironolactone 25 mg.  HLD-08/2022 LDL 121 with 10-year ASCVD risk score 13.2%.  He was started on rosuvastatin 10 mg daily.  Update FLP/LFT today.  If LDL not at goal plan to increase dose.  Obesity /BMI 36/cardiovascular risk factor-congratulated on 5 pound weight loss since last clinic visit.  Continued weight loss via diet and exercise encouraged. Discussed the impact being overweight would have on cardiovascular risk.  Given elevated cardiovascular risk, BMI 36 will Rx Wegovy.  Screening for Secondary Hypertension:     Relevant Labs/Studies:    Latest Ref Rng & Units 08/11/2022    8:12 AM 11/23/2020    3:30 PM 11/10/2019   10:03 AM  Basic Labs  Sodium 134 - 144 mmol/L 141  141  143   Potassium 3.5 - 5.2 mmol/L 3.7  4.0  3.9   Creatinine 0.76 - 1.27 mg/dL 1.61  0.96  0.45  Disposition:    FU with MD/PharmD in *** months    Medication Adjustments/Labs and Tests Ordered: Current medicines are reviewed at length with the patient today.  Concerns regarding medicines are outlined above.  No orders of the defined types were placed in this encounter.  No orders of the defined types were placed in this encounter.    Signed, Alver Sorrow, NP  04/09/2023 7:26 AM    [facility]

## 2023-04-14 ENCOUNTER — Other Ambulatory Visit (HOSPITAL_BASED_OUTPATIENT_CLINIC_OR_DEPARTMENT_OTHER): Payer: Self-pay | Admitting: *Deleted

## 2023-04-14 DIAGNOSIS — E7800 Pure hypercholesterolemia, unspecified: Secondary | ICD-10-CM

## 2023-04-14 MED ORDER — AMLODIPINE BESY-BENAZEPRIL HCL 10-40 MG PO CAPS: 1.0000 | ORAL_CAPSULE | Freq: Every day | ORAL | 2 refills | Status: DC

## 2023-04-14 MED ORDER — ROSUVASTATIN CALCIUM 10 MG PO TABS: 10.0000 mg | ORAL_TABLET | Freq: Every day | ORAL | 2 refills | Status: DC

## 2023-04-14 MED ORDER — SPIRONOLACTONE 25 MG PO TABS: 25.0000 mg | ORAL_TABLET | Freq: Every day | ORAL | 2 refills | Status: DC

## 2023-04-14 MED ORDER — CHLORTHALIDONE 25 MG PO TABS: 25.0000 mg | ORAL_TABLET | Freq: Every day | ORAL | 2 refills | Status: DC

## 2023-04-14 NOTE — Telephone Encounter (Signed)
Rx(s) sent to pharmacy electronically.  

## 2023-05-28 ENCOUNTER — Ambulatory Visit (INDEPENDENT_AMBULATORY_CARE_PROVIDER_SITE_OTHER): Payer: Managed Care, Other (non HMO) | Admitting: Family

## 2023-05-28 ENCOUNTER — Encounter (HOSPITAL_BASED_OUTPATIENT_CLINIC_OR_DEPARTMENT_OTHER): Payer: Self-pay | Admitting: Family

## 2023-05-28 VITALS — BP 130/84 | HR 66 | Ht >= 80 in | Wt 351.2 lb

## 2023-05-28 DIAGNOSIS — G4733 Obstructive sleep apnea (adult) (pediatric): Secondary | ICD-10-CM | POA: Diagnosis not present

## 2023-05-28 DIAGNOSIS — Z6838 Body mass index (BMI) 38.0-38.9, adult: Secondary | ICD-10-CM

## 2023-05-28 DIAGNOSIS — E782 Mixed hyperlipidemia: Secondary | ICD-10-CM

## 2023-05-28 DIAGNOSIS — I10 Essential (primary) hypertension: Secondary | ICD-10-CM | POA: Diagnosis not present

## 2023-05-28 NOTE — Progress Notes (Signed)
[facility] Assessment:    Date:  05/28/2023   ID:  Christopher Bruce, DOB Oct 07, 1975, MRN [id number]  PCP:  Christopher Si, MD  Cardiologist:  None  Nephrologist:  Referring MD: Christopher Si, MD   CC: Hypertension  History of Present Illness:    Christopher Bruce is a 48 y.o. male with a hx of obesity, hyperlipidemia, hypertension. Established with [facility] 09/2019.   Initially diagnosed with hypertension at age 80. Previously well controlled in [place] while taking Amlodipine. Moved to [place] and did not have insurance and no medications. At initial visit, Amlodipine resumed. Chlorthalidone added 10/2019 and Benazepril 12/2019 without significant improvement. Previously declined secondary workup due to lack of insurance. Saw Christopher Reining, NP 11/2020 and was off medication but recently got job with insurance and medications reordered. Echo 12/2020 LVEF 60-65%, severe LVH, gr1DD. At follow up 12/2020 BP well controlled. Referred for sleep study and started on CPAP.  Seen 07/14/22 by Christopher Bruce. His wife had passed away from cancer and he had lost his job one month later and been unable to obtain medications. He had since gotten a job and chlorthalidone, spironolactone, amlodipine/benazepril were resumed. Labs 08/16/2022 LDL 121.  He was recommended to start rosuvastatin 10 mg daily.  Last seen 12/2022 with successful weight loss with lifestyle changes. Wegovy/Zepbound were not covered by insurance.   Presents today for follow up independently.  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 Feeling good, just moved into a new apartment late summer. BPs at home running 120s-130s/80s. Admits to not taking medications during moving process and having higher BPs, but taking consistently since August and BP has been controlled. Not wearing CPAP since move, questions ability to get implantable device. Exercise at job, eating out and frozen  meals often. Reports no shortness of breath nor dyspnea on exertion. Reports no chest pain, pressure, or tightness. No edema, orthopnea, PND. Reports no palpitations.   Previous antihypertensives: Amlodipine  Past Medical History:  Diagnosis Date   Morbid obesity (HCC) 09/14/2019   Obesity (BMI 30-39.9) 09/14/2019   OSA (obstructive sleep apnea) 09/09/2021   Snoring 01/09/2020    Past Surgical History:  Procedure Laterality Date   CHOLECYSTECTOMY      Current Medications: Current Meds  Medication Sig   amLODipine-benazepril (LOTREL) 10-40 MG capsule Take 1 capsule by mouth daily.   chlorthalidone (HYGROTON) 25 MG tablet Take 1 tablet (25 mg total) by mouth daily.   rosuvastatin (CRESTOR) 10 MG tablet Take 1 tablet (10 mg total) by mouth daily.   spironolactone (ALDACTONE) 25 MG tablet Take 1 tablet (25 mg total) by mouth daily.     Allergies:   Patient has no known allergies.   Social History   Socioeconomic History   Marital status: Single    Spouse name: Not on file   Number of children: Not on file   Years of education: Not on file   Highest education level: Not on file  Occupational History   Not on file  Tobacco Use   Smoking status: Never   Smokeless tobacco: Never  Vaping Use   Vaping status: Never Used  Substance and Sexual Activity   Alcohol use: Yes   Drug use: No   Sexual activity: Not on file  Other Topics Concern   Not on file  Social History Narrative   Not on file   Social Determinants of Health   Financial Resource Strain: Not  on file  Food Insecurity: No Food Insecurity (09/14/2019)   Hunger Vital Sign    Worried About Running Out of Food in the Last Year: Never true    Ran Out of Food in the Last Year: Never true  Transportation Needs: No Transportation Needs (09/14/2019)   PRAPARE - Administrator, Civil Service (Medical): No    Lack of Transportation (Non-Medical): No  Physical Activity: Insufficiently Active (09/14/2019)    Exercise Vital Sign    Days of Exercise per Week: 7 days    Minutes of Exercise per Session: 20 min  Stress: No Stress Concern Present (09/14/2019)   [employer] [employer] - Occupational Stress Questionnaire    Feeling of Stress : Not at all  Social Connections: Not on file     Family History: The patient's family history includes Appendicitis in his mother; HIV in his father.  ROS:   Please see the history of present illness.     All other systems reviewed and are negative.  EKGs/Labs/Other Studies Reviewed:    EKG Interpretation Date/Time:  Thursday May 28 2023 08:20:59 EDT Ventricular Rate:  66 PR Interval:  206 QRS Duration:  112 QT Interval:  400 QTC Calculation: 419 R Axis:   27  Text Interpretation: Normal sinus rhythm Left ventricular hypertrophy Confirmed by Gillian Shields ([id number]) on 05/28/2023 8:22:56 AM    Recent Labs: 08/11/2022: BUN 14; Creatinine, Ser 1.03; Potassium 3.7; Sodium 141 12/25/2022: ALT 15   Recent Lipid Panel    Component Value Date/Time   CHOL 159 12/25/2022 0832   TRIG 78 12/25/2022 0832   HDL 54 12/25/2022 0832   CHOLHDL 2.9 12/25/2022 0832   LDLCALC 90 12/25/2022 0832    Physical Exam:   VS:  BP 130/84 (BP Location: Left Arm, Patient Position: Sitting, Cuff Size: Large)   Pulse 66   Ht 6\' 8"  (2.032 m)   Wt (!) 351 lb 3.2 oz (159.3 kg)   SpO2 98%   BMI 38.58 kg/m  , BMI Body mass index is 38.58 kg/m. GENERAL:  Well appearing, overweight HEENT: Pupils equal round and reactive, fundi not visualized, oral mucosa unremarkable NECK:  No jugular venous distention, waveform within normal limits, carotid upstroke brisk and symmetric, no bruits, no thyromegaly LYMPHATICS:  No cervical adenopathy LUNGS:  Clear to auscultation bilaterally HEART:  RRR.  PMI not displaced or sustained,S1 and S2 within normal limits, no S3, no S4, no clicks, no rubs, no murmurs ABD:  Flat, positive bowel sounds normal in frequency in pitch,  no bruits, no rebound, no guarding, no midline pulsatile mass, no hepatomegaly, no splenomegaly EXT:  2 plus pulses throughout, no edema, no cyanosis no clubbing SKIN:  No rashes no nodules NEURO:  Cranial nerves II through XII grossly intact, motor grossly intact throughout PSYCH:  Cognitively intact, oriented to person place and time  ASSESSMENT/PLAN:    HTN -  BP well controlled. Continue current antihypertensive regimen including amlodipine-benazepril 10-40 mg, chlorthalidone 25 mg, spironolactone 25 mg. Discussed to monitor BP at home at least 2 hours after medications and sitting for 5-10 minutes.  HLD - 12/2022 LDL 90. At goal <100. Continue Rosuvastatin 10mg  daily.   Obesity /BMI 38/cardiovascular risk factor- Weight loss medications not covered. Has gained weight, often eating frozen meals. Discussed to look for low sodium options. Recommend aiming for 150 minutes of moderate intensity activity per week and following a heart healthy diet.   OSA - Will reach out to Dr.  Tresa Endo about candidacy for implantable device. Encouraged CPAP compliance in the meantime.  Screening for Secondary Hypertension:     Relevant Labs/Studies:    Latest Ref Rng & Units 08/11/2022    8:12 AM 11/23/2020    3:30 PM 11/10/2019   10:03 AM  Basic Labs  Sodium 134 - 144 mmol/L 141  141  143   Potassium 3.5 - 5.2 mmol/L 3.7  4.0  3.9   Creatinine 0.76 - 1.27 mg/dL 3.87  5.64  3.32                    Disposition:    FU with MD/PharmD/APP in 6 months.   Medication Adjustments/Labs and Tests Ordered: Current medicines are reviewed at length with the patient today.  Concerns regarding medicines are outlined above.  Orders Placed This Encounter  Procedures   EKG 12-Lead   No orders of the defined types were placed in this encounter.    Signed, Alver Sorrow, NP  05/28/2023 9:10 AM    [facility]

## 2023-05-28 NOTE — Patient Instructions (Signed)
Medication Instructions:  Your physician recommends that you continue on your current medications as directed. Please refer to the Current Medication list given to you today.  *If you need a refill on your cardiac medications before your next appointment, please call your pharmacy*  Lab Work: NONE.   Testing/Procedures: NONE.   Follow-Up: At [facility], you and your health needs are our priority.  As part of our continuing mission to provide you with exceptional heart care, we have created designated Provider Care Teams.  These Care Teams include your primary Cardiologist (physician) and Advanced Practice Providers (APPs -  Physician Assistants and Nurse Practitioners) who all work together to provide you with the care you need, when you need it.  We recommend signing up for the patient portal called "MyChart".  Sign up information is provided on this After Visit Summary.  MyChart is used to connect with patients for Virtual Visits (Telemedicine).  Patients are able to view lab/test results, encounter notes, upcoming appointments, etc.  Non-urgent messages can be sent to your provider as well.   To learn more about what you can do with MyChart, go to [email or web address].    Your next appointment:   Follow up with Gillian Shields, NP in 6 months.  Other Instructions Please return to wearing CPAP - we will reach out to Dr. Tresa Endo to see if you are a candidate for [facility]

## 2023-08-14 ENCOUNTER — Telehealth: Payer: Self-pay | Admitting: Cardiology

## 2023-08-14 NOTE — Telephone Encounter (Signed)
 Dr. Mayford Knife,  This is a patient of Dr. Tresa Endo, last seen September 2022, he wants to know if he is a candidate for inspire. Should I schedule patient with you or have him follow up with Dr. Tresa Endo?

## 2023-08-17 NOTE — Telephone Encounter (Signed)
 Called patient. Patient unable to hear me due to a lot of back ground noise. Patient kept saying hello then yelled "stop playing on the damn phone". Will try back.

## 2023-08-18 NOTE — Telephone Encounter (Signed)
 Called and spoke to patient. Patient kept saying hello, hello. Attempted to introduce myself and he kept talking over me mumbling about I know you have a bill for me I told patient I was calling from Dr Joesphine office to get him schedule for an appointment. Patient disconnected the line.

## 2023-09-15 ENCOUNTER — Encounter: Payer: Self-pay | Admitting: Cardiovascular Disease

## 2023-09-15 ENCOUNTER — Ambulatory Visit: Payer: Managed Care, Other (non HMO) | Attending: Cardiovascular Disease | Admitting: Cardiovascular Disease

## 2023-09-15 VITALS — BP 130/88 | HR 64 | Ht >= 80 in | Wt 349.0 lb

## 2023-09-15 DIAGNOSIS — Z6838 Body mass index (BMI) 38.0-38.9, adult: Secondary | ICD-10-CM

## 2023-09-15 DIAGNOSIS — Z9189 Other specified personal risk factors, not elsewhere classified: Secondary | ICD-10-CM

## 2023-09-15 DIAGNOSIS — G4733 Obstructive sleep apnea (adult) (pediatric): Secondary | ICD-10-CM

## 2023-09-15 DIAGNOSIS — E782 Mixed hyperlipidemia: Secondary | ICD-10-CM

## 2023-09-15 DIAGNOSIS — E66812 Obesity, class 2: Secondary | ICD-10-CM | POA: Diagnosis not present

## 2023-09-15 DIAGNOSIS — E7800 Pure hypercholesterolemia, unspecified: Secondary | ICD-10-CM | POA: Diagnosis not present

## 2023-09-15 DIAGNOSIS — Z6836 Body mass index (BMI) 36.0-36.9, adult: Secondary | ICD-10-CM

## 2023-09-15 DIAGNOSIS — E669 Obesity, unspecified: Secondary | ICD-10-CM

## 2023-09-15 DIAGNOSIS — I10 Essential (primary) hypertension: Secondary | ICD-10-CM

## 2023-09-15 DIAGNOSIS — Z5181 Encounter for therapeutic drug level monitoring: Secondary | ICD-10-CM

## 2023-09-15 NOTE — Progress Notes (Signed)
Cardiology Office Note    Date:  09/17/2023   ID:  Christopher Bruce, DOB 08/11/1975, MRN [id number]  PCP:  Chilton Si, MD  Cardiologist:  Nicki Guadalajara, MD (sleep); Dr. Chilton Si  2 1/2 year F/U sleep evaluation.  History of Present Illness:  Christopher Bruce is a 48 y.o. male who presents for a 2-1/2-year follow-up sleep evaluation.  Christopher Bruce is followed by Dr. Duke Salvia for primary cardiology care.  He is originally from [place], [place], has a history of hypertension and significant obesity.  Most recently he has been on hypertensive regimen consisting of amlodipine/benazepril 10/40, chlorthalidone 25 mg daily, and spironolactone 25 mg daily.  Due to significant concerns for sleep apnea including snoring and his morbid obesity with some fatigability he was referred for a home sleep study.  This was done on Dec 20, 2020 and he was found to have severe obstructive sleep apnea with an AHI of 37.3/h.  There was severe oxygen desaturation to a nadir of 78%.  He had mild snoring for short duration during sleep.  He was not approved for an in lab titration and ultimately underwent AutoPap set up on January 28, 2021 [facility] as his DME company.  He received a ResMed air sense 11 CPAP machine which currently has been set at a pressure range of 7 to 20 cm of water.  A download was obtained from August 9 through April 10, 2021 which showed 77% of the nights with usage, but he was not being compliant with usage greater than 4 hours at only 47%.  His average use on days used was 4 hours and 19 minutes.  His 95th percentile pressure is 14.3 with maximum average pressure of 15.5 cm of water.  Most days he did not have a leak with the exception of 2 days leak was increased.  AHI was excellent at 0.9.  Since initiating CPAP therapy has noticed that he is sleeping better and longer.  He denies any recent residual daytime sleepiness and acute Epworth Sleepiness Scale score endorsed at 4  arguing against daytime sleepiness on therapy.  He denies any awareness of restless legs, bruxism, hypnagogic hallucinations or cataplectic events.  An echo Doppler study in May 2022 showed severe centric left ventricular hypertrophy with EF at 60 to 65%.  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  I saw him for my initial sleep evaluation on April 11, 2021.  He works third shift driving a Marine scientist.  On some days when he drives to [facility] he is able to get home relatively early and go to bed by- 10 o'clock but on days he drives to [place] he often does not get to bed till noon and typically had been waking up at 4:00 in the afternoon.  However since starting CPAP therapy he feels that he is able to sleep a little bit longer.  He admits to significant weight gain since October 04, 2014 and has gained pounds.  During his initial evaluation I spent considerable time with him reviewing normal sleep protector and potential disruption with untreated sleep apnea.  I extensively reviewed adverse cardiovascular potential risks if left untreated as outlined in my 04/11/2021 note.  Since I last saw him, he continues to be followed by Dr. Chilton Si for cardiology care.  He was seen in October 2024 by Gillian Shields, NP at that time, he had moved to a new dwelling after his wife died at age 79 in 10/04/21 from ovarian  cancer.  He essentially stopped using CPAP therapy following his move.  Presently, he admits that he is not sleeping well.  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  He typically goes to bed at 10 PM and often wakes up at 3:30 AM and is at work at 4 AM at a landfill.  He is in need to be transition to a new DME company since [facility] is no longer in the CPAP business.  It appears that his last time he used CPAP therapy was in June 2024.  He has a ResMed air sense 11 AutoSet unit with a pressure range set at 7 to 20 cm.  With minimal use, AHI was excellent at 0.7 and his 95th  percentile pressure was 11.9 with maximum average pressure at 4.5.  He presents for reevaluation.   Past Medical History:  Diagnosis Date   Morbid obesity (HCC) 09/14/2019   Obesity (BMI 30-39.9) 09/14/2019   OSA (obstructive sleep apnea) 09/09/2021   Snoring 01/09/2020    Past Surgical History:  Procedure Laterality Date   CHOLECYSTECTOMY      Current Medications: Outpatient Medications Prior to Visit  Medication Sig Dispense Refill   amLODipine-benazepril (LOTREL) 10-40 MG capsule Take 1 capsule by mouth daily. 90 capsule 2   chlorthalidone (HYGROTON) 25 MG tablet Take 1 tablet (25 mg total) by mouth daily. 90 tablet 2   rosuvastatin (CRESTOR) 10 MG tablet Take 1 tablet (10 mg total) by mouth daily. 90 tablet 2   spironolactone (ALDACTONE) 25 MG tablet Take 1 tablet (25 mg total) by mouth daily. 90 tablet 2   No facility-administered medications prior to visit.     Allergies:   Patient has no known allergies.   Social History   Socioeconomic History   Marital status: Single    Spouse name: Not on file   Number of children: Not on file   Years of education: Not on file   Highest education level: Not on file  Occupational History   Not on file  Tobacco Use   Smoking status: Never   Smokeless tobacco: Never  Vaping Use   Vaping status: Never Used  Substance and Sexual Activity   Alcohol use: Yes   Drug use: No   Sexual activity: Not on file  Other Topics Concern   Not on file  Social History Narrative   Not on file   Social Drivers of Health   Financial Resource Strain: Not on file  Food Insecurity: No Food Insecurity (09/14/2019)   Hunger Vital Sign    Worried About Running Out of Food in the Last Year: Never true    Ran Out of Food in the Last Year: Never true  Transportation Needs: No Transportation Needs (09/14/2019)   PRAPARE - Administrator, Civil Service (Medical): No    Lack of Transportation (Non-Medical): No  Physical Activity:  Insufficiently Active (09/14/2019)   Exercise Vital Sign    Days of Exercise per Week: 7 days    Minutes of Exercise per Session: 20 min  Stress: No Stress Concern Present (09/14/2019)   [name] of Occupational Health - Occupational Stress Questionnaire    Feeling of Stress : Not at all  Social Connections: Not on file    Additional social history is notable that he was born in [place], [place].  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  He moved to [place].  He is the  twin brother of the father of children of one of our employees.  He has 1 son who lives in [place].  He does not exercise regularly.  Family History:  The patient's family history includes Appendicitis in his mother; HIV in his father.  Is father is deceased and had HIV dying in his 48s.  Mother is alive at 81.  He has 1 twin brother and 1 older brother.  ROS General: Negative; No fevers, chills, or night sweats; 70 pound weight gain since 2016 HEENT: Negative; No changes in vision or hearing, sinus congestion, difficulty swallowing Pulmonary: Negative; No cough, wheezing, shortness of breath, hemoptysis Cardiovascular: Positive for hypertension and hyperlipidemia GI: Negative; No nausea, vomiting, diarrhea, or abdominal pain GU: Negative; No dysuria, hematuria, or difficulty voiding Musculoskeletal: Negative; no myalgias, joint pain, or weakness Hematologic/Oncology: Negative; no easy bruising, bleeding Endocrine: Negative; no heat/cold intolerance; no diabetes Neuro: Negative; no changes in balance, headaches Skin: Negative; No rashes or skin lesions Psychiatric: Negative; No behavioral problems, depression Sleep: See HPI  An Epworth Sleepiness Scale score was calculated in the office today and this endorsed at 3 arguing against  residual daytime sleepiness.  Other comprehensive 14 point system review is negative.   PHYSICAL EXAM:   VS:  BP 130/88   Pulse 64   Ht 6\' 9"  (2.057 m)    Wt (!) 349 lb (158.3 kg)   SpO2 94%   BMI 37.40 kg/m     Repeat blood pressure by me was 135/82.  Wt Readings from Last 3 Encounters:  09/15/23 (!) 349 lb (158.3 kg)  05/28/23 (!) 351 lb 3.2 oz (159.3 kg)  12/25/22 (!) 343 lb 3.2 oz (155.7 kg)    General: Alert, oriented, no distress.  Tall and mesomorphic. Skin: normal turgor, no rashes, warm and dry HEENT: Normocephalic, atraumatic. Pupils equal round and reactive to light; sclera anicteric; extraocular muscles intact;  Nose without nasal septal hypertrophy Mouth/Parynx benign; Mallinpatti scale 3/4 Neck: No JVD, no carotid bruits; normal carotid upstroke Lungs: clear to ausculatation and percussion; no wheezing or rales Chest wall: without tenderness to palpitation Heart: PMI not displaced, RRR, s1 s2 normal, 1/6 systolic murmur, no diastolic murmur, no rubs, gallops, thrills, or heaves Abdomen: soft, nontender; no hepatosplenomehaly, BS+; abdominal aorta nontender and not dilated by palpation. Back: no CVA tenderness Pulses 2+ Musculoskeletal: full range of motion, normal strength, no joint deformities Extremities: no clubbing cyanosis or edema, Homan's sign negative  Neurologic: grossly nonfocal; Cranial nerves grossly wnl Psychologic: Normal mood and affect    Studies/Labs Reviewed:   EKG Interpretation Date/Time:  Tuesday September 15 2023 08:25:23 EST Ventricular Rate:  64 PR Interval:  206 QRS Duration:  122 QT Interval:  402 QTC Calculation: 414 R Axis:   -42  Text Interpretation: Normal sinus rhythm Left axis deviation Left ventricular hypertrophy with QRS widening ( R in aVL , Cornell product ) When compared with ECG of 28-May-2023 08:20, QRS axis Shifted left Criteria for Septal infarct are no longer Present Confirmed by Nicki Guadalajara ([id number]) on 09/17/2023 4:49:26 PM    April 11, 2021 ECG (independently read by me): Normal sinus rhythm at 60 bpm, first-degree AV block with a PR interval at 2 2 6  ms; left  ventricular hypertrophy with repolarization changes.  Lateral T wave abnormality   Recent Labs:    Latest Ref Rng & Units 08/11/2022    8:12 AM 11/23/2020    3:30 PM 11/10/2019   10:03 AM  BMP  Glucose  70 - 99 mg/dL 161  88  78   BUN 6 - 24 mg/dL 14  12  12    Creatinine 0.76 - 1.27 mg/dL 0.96  0.45  4.09   BUN/Creat Ratio 9 - 20 14  10  11    Sodium 134 - 144 mmol/L 141  141  143   Potassium 3.5 - 5.2 mmol/L 3.7  4.0  3.9   Chloride 96 - 106 mmol/L 102  102  102   CO2 20 - 29 mmol/L 24  23  26    Calcium 8.7 - 10.2 mg/dL 9.7  9.3  9.4         Latest Ref Rng & Units 12/25/2022    8:32 AM 08/11/2022    8:12 AM 11/23/2020    3:30 PM  Hepatic Function  Total Protein 6.0 - 8.5 g/dL 7.1  6.4  6.8   Albumin 4.1 - 5.1 g/dL 4.8  4.2  4.2   AST 0 - 40 IU/L 25  17  18    ALT 0 - 44 IU/L 15  18  18    Alk Phosphatase 44 - 121 IU/L 99  86  89   Total Bilirubin 0.0 - 1.2 mg/dL 0.9  0.4  0.4   Bilirubin, Direct 0.00 - 0.40 mg/dL 8.11   9.14        Latest Ref Rng & Units 02/21/2014    1:26 PM  CBC  WBC 4.0 - 10.5 K/uL 13.0   Hemoglobin 13.0 - 17.0 g/dL 78.2   Hematocrit 95.6 - 52.0 % 48.1   Platelets 150 - 400 K/uL 199    Lab Results  Component Value Date   MCV 85.6 02/21/2014   No results found for: "TSH" Lab Results  Component Value Date   HGBA1C 5.0 09/14/2019     BNP No results found for: "BNP"  ProBNP No results found for: "PROBNP"   Lipid Panel     Component Value Date/Time   CHOL 159 12/25/2022 0832   TRIG 78 12/25/2022 0832   HDL 54 12/25/2022 0832   CHOLHDL 2.9 12/25/2022 0832   LDLCALC 90 12/25/2022 0832   LABVLDL 15 12/25/2022 0832     RADIOLOGY: No results found.   Additional studies/ records that were reviewed today include:    12/20/2020 CLINICAL INFORMATION Sleep Study Type: HST   Indication for sleep study: snoring   Epworth Sleepiness Score: 2   SLEEP STUDY TECHNIQUE A multi-channel overnight portable sleep study was performed. The  channels recorded were: nasal airflow, thoracic respiratory movement, and oxygen saturation with a pulse oximetry. Snoring was also monitored.   MEDICATIONS amLODipine-benazepril (LOTREL) 10-40 MG capsule chlorthalidone (HYGROTON) 25 MG tablet Multiple Vitamins-Minerals (MULTIVITAMIN WITH MINERALS) tablet spironolactone (ALDACTONE) 25 MG tablet Patient self administered medications include: N/A.   SLEEP ARCHITECTURE Patient was studied for 355.2 minutes. The sleep efficiency was 100.0 % and the patient was supine for 0%. The arousal index was 0.0 per hour.   RESPIRATORY PARAMETERS The overall AHI was 37.3 per hour, with a central apnea index of 0 per hour.   The oxygen nadir was 78% during sleep.   CARDIAC DATA Mean heart rate during sleep was 68.9 bpm.   IMPRESSIONS - Severe obstructive sleep apnea occurred during this study (AHI 37.3/h). The severity during REM sleep cannot be assessed on this home study. - Severe oxygen desaturation to a nadir of 78%. - Patient snored 2.9% during the sleep.   DIAGNOSIS - Obstructive Sleep Apnea (G47.33) - Nocturnal  Hypoxemia (G47.36)   RECOMMENDATIONS - Recommend a therapeutic CPAP titration study to further evaluate and treat his severe sleep disorded breathing. If unable to perform an in-lab titration, initiate Auto PAP with EPR of 3 at 7 - 20 cm of water. - Effort should be made to optimize nasal and oropharyngeal patency.  - Avoid alcohol, sedatives and other CNS depressants that may worsen sleep apnea and disrupt normal sleep architecture. - Sleep hygiene should be reviewed to assess factors that may improve sleep quality. - Weight management and regular exercise should be initiated or continued. - Return to Sleep Center to discuss the results of this study - Patient may benefit from in-lab study   ASSESSMENT:    1. OSA (obstructive sleep apnea)   2. Essential hypertension   3. Hypercholesterolemia   4. Obesity, Class II, BMI  35-39.9     PLAN:  Mr. Dorothy Landgrebe is a 48 year-old gentleman who has a history of hypertension, obesity, and is followed by Dr. Chilton Si.  He has a history of poor sleep with previous inability to sleep on his back as well as a history of mild snoring and nonrestorative sleep.  He was referred for home sleep study which revealed severe overall sleep apnea with an AHI of 37.3/h and significant oxygen desaturation to a nadir of 78%.  On January 28, 2021 CPAP auto therapy was initiated with a ResMed air sense 11 AutoSet unit with initial pressure set at a minimum of 7 and maximum of 20 cm of water. When I saw him for his initial sleep evaluation with therapy, AHI was excellent at 0.9.  He is meeting compliance standards with reference to usage days but is not compliant with reference to usage greater than 4 hours.  On average he was sleeping with CPAP only 4 hours and 19 minutes.  Some of this is contributed by his working the third shift and that at times he does not get home from work until 11:00 in the morning.  Often he goes to sleep by noon but previously was waking up around 4 or 5 in the afternoon.  Since he has been on CPAP he has noticed he is sleeping a little bit longer.  His initial sleep evaluation I had a lengthy discussion with him and discussed potential adverse cardiovascular consequences in detail if sleep apnea is left untreated.  Unfortunately, his wife died secondary to ovarian cancer at age 47 in late 2023.  He moved to a new apartment and subsequently has not really reinstituted CPAP therapy.  He states he has not gotten any supplies that he needs a new mask.  His previous DME company is no longer in service and he needs to be transferred to a new company based on his insurance.  Will set him up with [facility].  Provided him with a facemask today.  I discussed optimal sleep duration at at least 7 to 8 hours with the CPAP use if at all possible.  He typically does not give him  adequate sleep time often going to bed around 10 and waking up at 3:30 AM.  When I had last seen him, he had gained over 70 pounds over 6 years previously.  Weight today was 49 which is a 30 pound weight loss since his September 2022 evaluation when he weighed 379 pounds.  He continues to be on amlodipine/benazepril 10/40, chlorthalidone 25 mg, and spironolactone 25 mg daily for hypertension.  He is on rosuvastatin 10 mg for  hyperlipidemia.  He will return to the cardiology care of Dr. Duke Salvia.  I discussed with him my plans for retirement later this year.  He has his machine with set up date from August 27, 2021.  If sleep issues arise, follow-up from a sleep perspective will be with Dr. Mayford Knife.    Medication Adjustments/Labs and Tests Ordered: Current medicines are reviewed at length with the patient today.  Concerns regarding medicines are outlined above.  Medication changes, Labs and Tests ordered today are listed in the Patient Instructions below. Patient Instructions  Medication Instructions:  No medication changes were made during today's visit.  *If you need a refill on your cardiac medications before your next appointment, please call your pharmacy*   Lab Work: No labs were ordered during today's visit.  If you have labs (blood work) drawn today and your tests are completely normal, you will receive your results only by: MyChart Message (if you have MyChart) OR A paper copy in the mail If you have any lab test that is abnormal or we need to change your treatment, we will call you to review the results.    Follow-Up: At [facility], you and your health needs are our priority.  As part of our continuing mission to provide you with exceptional heart care, we have created designated Provider Care Teams.  These Care Teams include your primary Cardiologist (physician) and Advanced Practice Providers (APPs -  Physician Assistants and Nurse Practitioners) who all work together to  provide you with the care you need, when you need it.  We recommend signing up for the patient portal called "MyChart".  Sign up information is provided on this After Visit Summary.  MyChart is used to connect with patients for Virtual Visits (Telemedicine).  Patients are able to view lab/test results, encounter notes, upcoming appointments, etc.  Non-urgent messages can be sent to your provider as well.   To learn more about what you can do with MyChart, go to [email or web address].    Your next appointment:    AS NEEDED FOR SLEEP CONCERNS    Provider:       Other Instructions If you have any questions or concerns regarding your c-pap, bi-pap or sleep accessories, please contact Brandie Rorie at [phone].     Thank you for choosing [facility]!       Signed, Nicki Guadalajara, MD  09/17/2023 5:00 PM    [facility] [address], [address], [place], [place]  [address] Phone: [phone]

## 2023-09-15 NOTE — Patient Instructions (Addendum)
Medication Instructions:  No medication changes were made during today's visit.  *If you need a refill on your cardiac medications before your next appointment, please call your pharmacy*   Lab Work: No labs were ordered during today's visit.  If you have labs (blood work) drawn today and your tests are completely normal, you will receive your results only by: MyChart Message (if you have MyChart) OR A paper copy in the mail If you have any lab test that is abnormal or we need to change your treatment, we will call you to review the results.    Follow-Up: At [facility], you and your health needs are our priority.  As part of our continuing mission to provide you with exceptional heart care, we have created designated Provider Care Teams.  These Care Teams include your primary Cardiologist (physician) and Advanced Practice Providers (APPs -  Physician Assistants and Nurse Practitioners) who all work together to provide you with the care you need, when you need it.  We recommend signing up for the patient portal called "MyChart".  Sign up information is provided on this After Visit Summary.  MyChart is used to connect with patients for Virtual Visits (Telemedicine).  Patients are able to view lab/test results, encounter notes, upcoming appointments, etc.  Non-urgent messages can be sent to your provider as well.   To learn more about what you can do with MyChart, go to [email or web address].    Your next appointment:    AS NEEDED FOR SLEEP CONCERNS    Provider:       Other Instructions If you have any questions or concerns regarding your c-pap, bi-pap or sleep accessories, please contact Brandie Rorie at [phone].     Thank you for choosing [facility]!

## 2023-09-17 ENCOUNTER — Encounter: Payer: Self-pay | Admitting: Cardiovascular Disease

## 2023-09-17 NOTE — Telephone Encounter (Signed)
Patient scheduled for 09/15/23 at 8:20am with Dr. Tresa Endo

## 2023-09-17 NOTE — Addendum Note (Signed)
Addended by: Nicki Guadalajara A on: 09/17/2023 05:01 PM   Modules accepted: Level of Service

## 2023-12-17 ENCOUNTER — Other Ambulatory Visit (HOSPITAL_BASED_OUTPATIENT_CLINIC_OR_DEPARTMENT_OTHER): Payer: Self-pay | Admitting: *Deleted

## 2023-12-17 DIAGNOSIS — E7800 Pure hypercholesterolemia, unspecified: Secondary | ICD-10-CM

## 2023-12-17 MED ORDER — CHLORTHALIDONE 25 MG PO TABS: 25.0000 mg | ORAL_TABLET | Freq: Every day | ORAL | 1 refills | Status: DC

## 2023-12-17 MED ORDER — SPIRONOLACTONE 25 MG PO TABS: 25.0000 mg | ORAL_TABLET | Freq: Every day | ORAL | 1 refills | Status: DC

## 2023-12-17 MED ORDER — ROSUVASTATIN CALCIUM 10 MG PO TABS: 10.0000 mg | ORAL_TABLET | Freq: Every day | ORAL | 1 refills | Status: DC

## 2023-12-17 MED ORDER — AMLODIPINE BESY-BENAZEPRIL HCL 10-40 MG PO CAPS: 1.0000 | ORAL_CAPSULE | Freq: Every day | ORAL | 1 refills | Status: DC

## 2023-12-17 NOTE — Telephone Encounter (Signed)
 Refilled medications and scheduled follow up for 7/22

## 2024-02-22 ENCOUNTER — Encounter: Payer: Self-pay | Admitting: *Deleted

## 2024-02-23 ENCOUNTER — Encounter (HOSPITAL_BASED_OUTPATIENT_CLINIC_OR_DEPARTMENT_OTHER): Payer: Self-pay | Admitting: Cardiovascular Disease

## 2024-02-23 ENCOUNTER — Other Ambulatory Visit (HOSPITAL_BASED_OUTPATIENT_CLINIC_OR_DEPARTMENT_OTHER): Payer: Self-pay

## 2024-02-23 ENCOUNTER — Ambulatory Visit (HOSPITAL_BASED_OUTPATIENT_CLINIC_OR_DEPARTMENT_OTHER): Admitting: Cardiovascular Disease

## 2024-02-23 ENCOUNTER — Other Ambulatory Visit (HOSPITAL_BASED_OUTPATIENT_CLINIC_OR_DEPARTMENT_OTHER): Payer: Self-pay | Admitting: *Deleted

## 2024-02-23 VITALS — BP 170/84 | HR 73 | Ht >= 80 in | Wt 347.0 lb

## 2024-02-23 DIAGNOSIS — I10 Essential (primary) hypertension: Secondary | ICD-10-CM | POA: Diagnosis not present

## 2024-02-23 DIAGNOSIS — Z5181 Encounter for therapeutic drug level monitoring: Secondary | ICD-10-CM

## 2024-02-23 DIAGNOSIS — E669 Obesity, unspecified: Secondary | ICD-10-CM

## 2024-02-23 DIAGNOSIS — G4733 Obstructive sleep apnea (adult) (pediatric): Secondary | ICD-10-CM | POA: Diagnosis not present

## 2024-02-23 DIAGNOSIS — E7800 Pure hypercholesterolemia, unspecified: Secondary | ICD-10-CM | POA: Diagnosis not present

## 2024-02-23 LAB — BASIC METABOLIC PANEL WITH GFR
BUN/Creatinine Ratio: 8 — ABNORMAL LOW (ref 9–20)
BUN: 10 mg/dL (ref 6–24)
CO2: 22 mmol/L (ref 20–29)
Calcium: 9.3 mg/dL (ref 8.7–10.2)
Chloride: 103 mmol/L (ref 96–106)
Creatinine, Ser: 1.18 mg/dL (ref 0.76–1.27)
Glucose: 85 mg/dL (ref 70–99)
Potassium: 4.3 mmol/L (ref 3.5–5.2)
Sodium: 143 mmol/L (ref 134–144)
eGFR: 77 mL/min/1.73 (ref >59)

## 2024-02-23 MED ORDER — CHLORTHALIDONE 25 MG PO TABS
25.0000 mg | ORAL_TABLET | Freq: Every day | ORAL | 3 refills | Status: DC
  Filled 2024-02-23: qty 90, 90d supply, fill #0

## 2024-02-23 MED ORDER — ROSUVASTATIN CALCIUM 10 MG PO TABS: 10.0000 mg | ORAL_TABLET | Freq: Every day | ORAL | 3 refills | Status: AC

## 2024-02-23 MED ORDER — SPIRONOLACTONE 25 MG PO TABS
25.0000 mg | ORAL_TABLET | Freq: Every day | ORAL | 3 refills | Status: DC
  Filled 2024-02-23: qty 90, 90d supply, fill #0

## 2024-02-23 MED ORDER — CHLORTHALIDONE 25 MG PO TABS: 25.0000 mg | ORAL_TABLET | Freq: Every day | ORAL | 3 refills | Status: AC

## 2024-02-23 MED ORDER — ROSUVASTATIN CALCIUM 10 MG PO TABS
10.0000 mg | ORAL_TABLET | Freq: Every day | ORAL | 3 refills | Status: DC
Start: 2024-02-23 — End: 2024-02-23
  Filled 2024-02-23: qty 90, 90d supply, fill #0

## 2024-02-23 MED ORDER — AMLODIPINE BESY-BENAZEPRIL HCL 10-40 MG PO CAPS: 1.0000 | ORAL_CAPSULE | Freq: Every day | ORAL | 3 refills | Status: AC

## 2024-02-23 MED ORDER — SPIRONOLACTONE 25 MG PO TABS: 25.0000 mg | ORAL_TABLET | Freq: Every day | ORAL | 3 refills | Status: AC

## 2024-02-23 MED ORDER — AMLODIPINE BESY-BENAZEPRIL HCL 10-40 MG PO CAPS
1.0000 | ORAL_CAPSULE | Freq: Every day | ORAL | 3 refills | Status: DC
  Filled 2024-02-23: qty 90, 90d supply, fill #0

## 2024-02-23 NOTE — Progress Notes (Signed)
 Hypertension Clinic Initial Assessment:    Date:  02/23/2024   ID:  Christopher Bruce, DOB 08-Jul-1976, MRN [id number]  PCP:  Raford Riggs, MD  Cardiologist:  None  Nephrologist:  Referring MD: Raford Riggs, MD   CC: Hypertension  History of Present Illness:    Christopher Bruce is a 48 y.o. male with a hx of hypertension, OSA, and obestiy here for follow up.  He established care in the advanced hypertension clinic 09/2019.  He reports that he was first diagnosed with hypertension at around 36.  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  He followed up with the doctor regularly and his BP was well-controlled on amlodipine  10 mg.  When he moved to [place]  he did not have insurance and he was not on his medicine for a while.  His blood pressure was elevated over 200 systolic.  At his first appointment amlodipine  was restarted.  He was referred for sleep study but has not been able to be patient due to lack of insurance. Chlorthalidone  was added 10/2019 and benazepril  12/2019 without a significant improvement in his blood pressure.  At his initial visit we discussed the further work-up for secondary causes, but given his lack of insurance we deferred renal Dopplers and additional testing.  We did start him on spironolactone  25 mg daily and discussed the importance of increasing exercise and limiting sodium.  He was not seen again until he followed up with Lamarr Satterfield on 11/2020.  He had been off his medication but recently got a new job with insurance and his medications were reordered.  He also ordered an echo 12/2020 that revealed LVEF 60 to 65% with severe LVH and grade 1 diastolic dysfunction.  At his follow-up visit 12/2020 his blood pressure was 130/80 and he was feeling well.  He had also lost 12 pounds and was really focused on diet and exercise.  He was referred for a sleep study and started on CPAP.  When he saw Dr. Burnard 04/2021 he noted that he was  feeling much better since starting treatment.  Christopher Bruce' wife passed away from cancer. At his last visit his BP was uncontrolled and Spironolactone  was added.  Today, he states that he is doing okay from a CV perspective. He lost his job 1 month after his wife passed away in 02-24-2022. He has not had any of his medications since that time. He wanted to follow up today to restart his medication regimen. He has since gotten a new job. He has lost 18lbs since February 2023 which he attributes to poor appetite and depression after his wife passed. He has been compliant with his CPAP and has had more energy with using it. He notes using topical Voltaren for his right shoulder pain with good control.  Discussed the use of AI scribe software for clinical note transcription with the patient, who gave verbal consent to proceed.  History of Present Illness Christopher Bruce has been off his blood pressure medications, amlodipine  and benazepril , due to traveling for a funeral and financial constraints. While on medication, his blood pressure was stable and well-controlled.  . No shortness of breath, chest pain, or leg swelling.  He has not been taking his cholesterol medication and acknowledges that his cholesterol levels might be elevated.  He has not been engaging in formal exercise but describes his work as physically demanding, involving activities like throwing ropes and pulling heavy items.   He  is moving to [place] next week for work. His 71 year old son lives in [place]  and is not currently driving but has a car waiting for him.  Previous antihypertensives: Amlodipine    Past Medical History:  Diagnosis Date   Morbid obesity (HCC) 09/14/2019   Obesity (BMI 30-39.9) 09/14/2019   OSA (obstructive sleep apnea) 09/09/2021   Snoring 01/09/2020    Past Surgical History:  Procedure Laterality Date   CHOLECYSTECTOMY      Current Medications: Current Meds  Medication Sig   [DISCONTINUED]  amLODipine -benazepril  (LOTREL ) 10-40 MG capsule Take 1 capsule by mouth daily.   [DISCONTINUED] chlorthalidone  (HYGROTON ) 25 MG tablet Take 1 tablet (25 mg total) by mouth daily.   [DISCONTINUED] rosuvastatin  (CRESTOR ) 10 MG tablet Take 1 tablet (10 mg total) by mouth daily.   [DISCONTINUED] spironolactone  (ALDACTONE ) 25 MG tablet Take 1 tablet (25 mg total) by mouth daily.     Allergies:   Patient has no known allergies.   Social History   Socioeconomic History   Marital status: Single    Spouse name: Not on file   Number of children: Not on file   Years of education: Not on file   Highest education level: Not on file  Occupational History   Not on file  Tobacco Use   Smoking status: Never   Smokeless tobacco: Never  Vaping Use   Vaping status: Never Used  Substance and Sexual Activity   Alcohol use: Yes   Drug use: No   Sexual activity: Not on file  Other Topics Concern   Not on file  Social History Narrative   Not on file   Social Drivers of Health   Financial Resource Strain: Not on file  Food Insecurity: No Food Insecurity (09/14/2019)   Hunger Vital Sign    Worried About Running Out of Food in the Last Year: Never true    Ran Out of Food in the Last Year: Never true  Transportation Needs: No Transportation Needs (09/14/2019)   PRAPARE - Administrator, Civil Service (Medical): No    Lack of Transportation (Non-Medical): No  Physical Activity: Insufficiently Active (09/14/2019)   Exercise Vital Sign    Days of Exercise per Week: 7 days    Minutes of Exercise per Session: 20 min  Stress: No Stress Concern Present (09/14/2019)   [name] of Occupational Health - Occupational Stress Questionnaire    Feeling of Stress : Not at all  Social Connections: Not on file     Family History: The patient's family history includes Appendicitis in his mother; HIV in his father.  ROS:   Please see the history of present illness. + Right shoulder pain +  Weight loss    + Depression All other systems reviewed and are negative.  EKGs/Labs/Other Studies Reviewed:    EKG: EKG ordered today, 07/14/22.  The ekg ordered today demonstrates Sinus rhythm rate of 69bpm, 1st degree AV block, and LVH with repolarization abnormalities. 09/09/2021: sinus rhythm.  Rate 92 bpm.  LAD.  Incomplete LBBB.  LVH with repolarization abnormalities.   Echo 12/2020: 1. Left ventricular ejection fraction, by estimation, is 60 to 65%. Left  ventricular ejection fraction by PLAX is 61 %. The left ventricle has  normal function. The left ventricle has no regional wall motion  abnormalities. There is severe concentric left   ventricular hypertrophy. Left ventricular diastolic parameters are  consistent with Grade I diastolic dysfunction (impaired relaxation).   2. Right ventricular systolic function is normal.  The right ventricular  size is normal.   3. The mitral valve is normal in structure. Trivial mitral valve  regurgitation. No evidence of mitral stenosis.   4. The aortic valve is tricuspid. Aortic valve regurgitation is trivial.  No aortic stenosis is present.   5. Aortic dilatation noted. There is mild dilatation of the ascending  aorta, measuring 37 mm.   6. The inferior vena cava is normal in size with greater than 50%  respiratory variability, suggesting right atrial pressure of 3 mmHg.   Recent Labs: No results found for requested labs within last 365 days.   Recent Lipid Panel    Component Value Date/Time   CHOL 159 12/25/2022 0832   TRIG 78 12/25/2022 0832   HDL 54 12/25/2022 0832   CHOLHDL 2.9 12/25/2022 0832   LDLCALC 90 12/25/2022 0832    Physical Exam:     VS:  BP (!) 170/84   Pulse 73   Ht 6' 9 (2.057 m)   Wt (!) 347 lb (157.4 kg)   SpO2 96%   BMI 37.18 kg/m  , BMI Body mass index is 37.18 kg/m. GENERAL:  Well appearing HEENT: Pupils equal round and reactive, fundi not visualized, oral mucosa unremarkable NECK:  No jugular venous  distention, waveform within normal limits, carotid upstroke brisk and symmetric, no bruits LUNGS:  Clear to auscultation bilaterally HEART:  RRR.  PMI not displaced or sustained,S1 and S2 within normal limits, no S3, no S4, no clicks, no rubs, no murmurs ABD:  Flat, positive bowel sounds normal in frequency in pitch, no bruits, no rebound, no guarding, no midline pulsatile mass, no hepatomegaly, no splenomegaly EXT:  2 plus pulses throughout, no edema, no cyanosis no clubbing SKIN:  No rashes no nodules NEURO:  Cranial nerves II through XII grossly intact, motor grossly intact throughout PSYCH:  Cognitively intact, oriented to person place and time  ASSESSMENT:    1. Essential hypertension   2. OSA (obstructive sleep apnea)   3. Obesity (BMI 30-39.9)   4. Hypercholesterolemia   5. Therapeutic drug monitoring      PLAN:    Assessment & Plan # Hypertension Uncontrolled due to interrupted management. Current BP 170/86 mmHg. Target 130/80 mmHg. - Prescribed 46-month supply of amlodipine /benazepril , chlorthalidone  and spironolactone .  - Advised immediate resumption of medication. - Monitor BP and report if not achieving target 130/80 mmHg. - Encouraged increased physical activity.  # Hyperlipidemia Management interrupted, likely elevated cholesterol levels. - Advised resumption of cholesterol medication. - Plan to recheck cholesterol levels in a couple of months at [facility].   Disposition:   f/u in 6 months.   Medication Adjustments/Labs and Tests Ordered: Current medicines are reviewed at length with the patient today.  Concerns regarding medicines are outlined above.  Orders Placed This Encounter  Procedures   Basic metabolic panel with GFR   Lipid panel   Comprehensive metabolic panel with GFR   Meds ordered this encounter  Medications   amLODipine -benazepril  (LOTREL ) 10-40 MG capsule    Sig: Take 1 capsule by mouth daily.    Dispense:  90 capsule    Refill:  3    rosuvastatin  (CRESTOR ) 10 MG tablet    Sig: Take 1 tablet (10 mg total) by mouth daily.    Dispense:  90 tablet    Refill:  3   chlorthalidone  (HYGROTON ) 25 MG tablet    Sig: Take 1 tablet (25 mg total) by mouth daily.    Dispense:  90 tablet  Refill:  3   spironolactone  (ALDACTONE ) 25 MG tablet    Sig: Take 1 tablet (25 mg total) by mouth daily.    Dispense:  90 tablet    Refill:  3     Signed, Annabella Scarce, MD  02/23/2024 9:40 AM    [facility]

## 2024-02-23 NOTE — Patient Instructions (Addendum)
 Medication Instructions:  RESUME YOUR MEDICATIONS DAILY   Labwork: BMET TODAY   FASTING LP/CMET IN 3 MONTHS   Testing/Procedures: NONE   Follow-Up: 6 MONTHS WITH DR Kidron OR CAITLIN W NP

## 2024-02-23 NOTE — Telephone Encounter (Signed)
 Received call that patients meds not covered at [facility] pharmacy, sent to [facility] as requested

## 2024-02-28 ENCOUNTER — Ambulatory Visit: Payer: Self-pay | Admitting: Cardiovascular Disease

## 2024-03-03 ENCOUNTER — Other Ambulatory Visit: Payer: Self-pay

## 2024-03-03 ENCOUNTER — Emergency Department (HOSPITAL_BASED_OUTPATIENT_CLINIC_OR_DEPARTMENT_OTHER)
Admission: EM | Admit: 2024-03-03 | Discharge: 2024-03-03 | Disposition: A | Discharge status: Home / Self Care | Attending: Emergency Medicine | Admitting: Emergency Medicine

## 2024-03-03 ENCOUNTER — Encounter (HOSPITAL_BASED_OUTPATIENT_CLINIC_OR_DEPARTMENT_OTHER): Payer: Self-pay

## 2024-03-03 DIAGNOSIS — Z79899 Other long term (current) drug therapy: Secondary | ICD-10-CM | POA: Diagnosis not present

## 2024-03-03 DIAGNOSIS — I10 Essential (primary) hypertension: Secondary | ICD-10-CM | POA: Insufficient documentation

## 2024-03-03 DIAGNOSIS — R030 Elevated blood-pressure reading, without diagnosis of hypertension: Secondary | ICD-10-CM

## 2024-03-03 DIAGNOSIS — R040 Epistaxis: Secondary | ICD-10-CM | POA: Diagnosis present

## 2024-03-03 HISTORY — DX: Essential (primary) hypertension: I10

## 2024-03-03 MED ORDER — TRANEXAMIC ACID 1000 MG/10ML IV SOLN
500.0000 mg | Freq: Once | INTRAVENOUS | Status: AC
  Administered 2024-03-03: 500 mg via TOPICAL
  Filled 2024-03-03: qty 10

## 2024-03-03 NOTE — ED Provider Notes (Signed)
 [facility] EMERGENCY DEPARTMENT AT [facility] Provider Note   CSN: [id number] Arrival date & time: 03/03/24  9144     Patient presents with: Epistaxis   Christopher Bruce is a 48 y.o. male with a past medical history of hypertension, obesity and obstructive sleep apnea currently noncompliant with use of his CPAP and who has been off of his blood pressure medications until 5 days ago who presents to the emergency department with epistaxis.  Patient reports intermittent but controllable nosebleeds for the past 4 days.  Patient reports that this morning he woke up to take his blood pressure medication had a nosebleed was able to get it to stop went back to bed for a while and then woke back up with persistent heavy bleeding from his left naris with associated diaphoresis.  He was unable to get it to stop and came here.  He is notably hypertensive.   He reports that he does not use any nasal sprays.  He has noticed large clots when removing packing from his naris.    Epistaxis      Prior to Admission medications   Medication Sig Start Date End Date Taking? Authorizing Provider  amLODipine -benazepril  (LOTREL ) 10-40 MG capsule Take 1 capsule by mouth daily. 02/23/24   Raford Riggs, MD  chlorthalidone  (HYGROTON ) 25 MG tablet Take 1 tablet (25 mg total) by mouth daily. 02/23/24   Raford Riggs, MD  rosuvastatin  (CRESTOR ) 10 MG tablet Take 1 tablet (10 mg total) by mouth daily. 02/23/24 02/17/25  Raford Riggs, MD  spironolactone  (ALDACTONE ) 25 MG tablet Take 1 tablet (25 mg total) by mouth daily. 02/23/24   Raford Riggs, MD    Allergies: Patient has no known allergies.    Review of Systems  HENT:  Positive for nosebleeds.     Updated Vital Signs BP (!) 186/100 (BP Location: Right Arm)   Pulse 73   Temp 98.1 F (36.7 C) (Oral)   Resp 18   Ht 6' 9 (2.057 m)   Wt (!) 157.4 kg   SpO2 95%   BMI 37.18 kg/m   Physical Exam Vitals and nursing note reviewed.   Constitutional:      General: He is not in acute distress.    Appearance: He is well-developed. He is not diaphoretic.  HENT:     Head: Normocephalic and atraumatic.     Nose:     Left Nostril: Epistaxis present.     Left Turbinates: Enlarged and swollen.     Comments: Left nasal turbinates and nasal mucosa appear somewhat swollen and and there is some retained blood products but no obvious source of bleeding Eyes:     General: No scleral icterus.    Conjunctiva/sclera: Conjunctivae normal.  Cardiovascular:     Rate and Rhythm: Normal rate and regular rhythm.     Heart sounds: Normal heart sounds.  Pulmonary:     Effort: Pulmonary effort is normal. No respiratory distress.     Breath sounds: Normal breath sounds.  Abdominal:     Palpations: Abdomen is soft.     Tenderness: There is no abdominal tenderness.  Musculoskeletal:     Cervical back: Normal range of motion and neck supple.  Skin:    General: Skin is warm and dry.  Neurological:     Mental Status: He is alert.  Psychiatric:        Behavior: Behavior normal.     (all labs ordered are listed, but only abnormal results are displayed) Labs Reviewed -  No data to display  EKG: None  Radiology: No results found.   Procedures   Medications Ordered in the ED  tranexamic acid  (CYKLOKAPRON ) injection 500 mg (has no administration in time range)    Clinical Course as of 03/03/24 1110  Thu Mar 03, 2024  1048 Epistaxis resolved. BP improved [AH]    Clinical Course User Index [AH] Natlie Asfour, PA-C                                 Medical Decision Making Risk Prescription drug management.   48 year old male here with intermittent epistaxis for the past 1 week, elevated blood pressure.  On exam this appears to be an anterior nosebleed although I am unable to visualize the source of bleeding. His bleeding had significantly resolved prior to my evaluation however he received nebulized TXA treatment and has  total resolution of all bleeding and improvement in the tissue appearance in his nasal cavity.  Blood pressure is also improved with time here. Patient is advised to follow closely with his primary care doctors about blood pressure management and encouraged to use his CPAP machine. I discussed home care for epistaxis management, outpatient follow-up and strict return precautions with the patient who appears otherwise appropriate for discharge at this time.     Final diagnoses:  Epistaxis  Elevated blood pressure reading    ED Discharge Orders     None          Arloa Chroman, PA-C 03/03/24 1112    Ruthe Cornet, DO 03/03/24 1217

## 2024-03-03 NOTE — ED Triage Notes (Signed)
 On Sunday started nose bleed.  Off and on Today started continuously. Has hypertension.  Took meds this am.

## 2024-03-03 NOTE — Discharge Instructions (Addendum)
 ### Home Management of Epistaxis     If a nosebleed occurs, the first step is to sit down and lean slightly forward. This position helps prevent blood from going down the throat, which can cause choking or vomiting. Pinch the soft lower part of the nose firmly with your thumb and index finger for 15 to 20 minutes without releasing pressure. Breathe through your mouth during this time. This simple step stops most nosebleeds by applying pressure to the blood vessels inside the nose.[1][2][3]      If bleeding continues after this, a doctor may recommend placing a small piece of gauze soaked in oxymetazoline (a nasal spray that narrows blood vessels) inside the nose to help stop the bleeding. Oxymetazoline is effective in controlling bleeding quickly and is generally safe when used as directed, but it should be used cautiously in people with high blood pressure or heart problems. The gauze should be gently removed 20-30 min if it is nonresorbable, or follow-up with a healthcare provider is needed if resorbable packing is used.[3][4][1]      To help prevent future nosebleeds, keep the inside of the nose moist. Use saline nasal sprays or gels regularly to lubricate the nasal lining. Avoid picking the nose, blowing it hard, or doing heavy lifting or strenuous activities for at least one week after a nosebleed. Using a humidifier at home can also help keep nasal tissues from drying out.[1][3]      Controlling blood pressure is important because high blood pressure can contribute to nosebleeds. Patients should follow their healthcare provider's advice on managing blood pressure through lifestyle changes and medications if needed.[3]      Seek immediate medical care in the emergency room if any of the following occur:      - Bleeding lasts longer than 20 minutes despite applying pressure.      - Bleeding is very heavy or causes difficulty breathing.      - Blood is swallowed repeatedly, causing vomiting.       - There is a head injury or facial trauma associated with the nosebleed.      - You feel faint, weak, or have signs of shock (such as rapid heartbeat or cold, clammy skin).      - You are taking blood thinners or have a bleeding disorder and the nosebleed is severe or does not stop.[1][2]      Following these steps can help manage most nosebleeds safely at home and reduce the chance of recurrence. If nosebleeds happen frequently or are severe, consult a healthcare provider for further evaluation and treatment.[1][3]      Nasal Hygiene The nose has many positive effects on the air you breathe in that you may not be aware of. - Temperature regulation - Filtration and removal of particulate matter - Humidification - Defense against infections There are several things you can do to help keep your nose healthy. Foremost is nasal hygiene. This will help with your nose's natural function and keep it moist and healthy. Techniques to accomplish this are outlined below. These will help with nasal dryness, nasal crusting, and nose bleeds. They also assist with clearing thick mucus that cause you to blow your nose frequently and may be associated with thick postnasal drip.  1. Use nasal saline daily. You can buy small bottles of this over-the-counter at the drug store or grocery store. Some brand names are: [address], [employer], [place]. Apply 2-3 sprays each nostril several times a day. If your nose  feels dry or have had recent nasal surgery, try to use it every couple of hours. There is no medicine in it so it can be used as often as you like. Do NOT use the sprays containing decongestants. The appropriate way to apply nasal sprays: Place the nozzle just inside your nostril and point it towards the corner of your eye. Often, it is helpful to use the right-hand to spray into the left nasal cavity and use the left-hand to spray into the right side.  2. Use nasal saline irrigations and  flushing.SinuCleanse, Simply Saline, [place]. Use this 1-2 times a day. We can also provide you with a recipe to use at home. Just ask us . To prevent reintroducing bacteria back into your nose, please keep your irrigation equipment clean and dry between uses. Throw away and replace reusable irrigation equipment every 3 weeks.   3. Use Vaseline petroleum jelly or Aquaphor. You can apply this gently to each nostril 2-3 times a day to promote moisturization for your nose. You may also use triple antibiotic ointment such as Neosporin or Bacitracin. These can all be bought over-the-counter.  4. Consider other nasal emollients. A few preparations are available over-thecounter. Ponaris, Nose Better, Pretz. Ask your pharmacist what is available. Also, some nasal saline sprays have additives such as aloe and these are helpful.  5. Consider using a humidifier at home. If your nose feels dry and/or you have frequent nose bleeds, you can buy a humidifier for your home. Be cautious in using these if you have mold allergies.  6. Avoid excessive manual manipulation of your nose and nostrils. Frequent rubbing of your nostrils and the passing of tissues or fingers in your nostrils may aggravate nasal irritation from dryness and nose bleeds.

## 2024-07-13 ENCOUNTER — Encounter (HOSPITAL_BASED_OUTPATIENT_CLINIC_OR_DEPARTMENT_OTHER): Payer: Self-pay | Admitting: Cardiovascular Disease
